# Patient Record
Sex: Male | Born: 2000 | Race: White | Hispanic: Yes | Marital: Single | Smoking: Never smoker
Health system: Southern US, Community
[De-identification: ages and names within clinical notes are randomized; demographics above are authoritative.]

## PROBLEM LIST (undated history)

## (undated) DIAGNOSIS — Z9109 Other allergy status, other than to drugs and biological substances: Secondary | ICD-10-CM

## (undated) DIAGNOSIS — L309 Dermatitis, unspecified: Secondary | ICD-10-CM

## (undated) HISTORY — PX: TONSILLECTOMY AND ADENOIDECTOMY: SUR1326

---
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
## 2019-02-01 ENCOUNTER — Encounter: Payer: Self-pay | Admitting: Emergency Medicine

## 2019-02-01 ENCOUNTER — Ambulatory Visit
Admission: EM | Admit: 2019-02-01 | Discharge: 2019-02-01 | Disposition: A | Discharge status: Home / Self Care | Payer: Medicaid Other | Attending: Emergency Medicine | Admitting: Emergency Medicine

## 2019-02-01 ENCOUNTER — Other Ambulatory Visit: Payer: Self-pay

## 2019-02-01 DIAGNOSIS — Z7251 High risk heterosexual behavior: Secondary | ICD-10-CM | POA: Diagnosis not present

## 2019-02-01 DIAGNOSIS — Z113 Encounter for screening for infections with a predominantly sexual mode of transmission: Secondary | ICD-10-CM | POA: Insufficient documentation

## 2019-02-01 NOTE — ED Provider Notes (Signed)
[facility]    CSN: [id number] Arrival date & time: 02/01/19  4098      History   Chief Complaint Chief Complaint  Patient presents with  . Exposure to STD    HPI Phillip Lopez is a 18 y.o. adult presenting for STD check.  Patient is asymptomatic, afebrile, and without known STD exposure, though endorsing unprotected intercourse with multiple male partners.    Past Medical History:  Diagnosis Date  . Asthma   . Environmental allergies     Patient Active Problem List   Diagnosis Date Noted  . OVERWEIGHT 03/02/2009  . ASTHMA 08/20/2007  . SINUSITIS-CHRONIC 11/10/2006  . ALLERGIC RHINITIS 07/21/2006    Past Surgical History:  Procedure Laterality Date  . TONSILLECTOMY AND ADENOIDECTOMY         Home Medications    Prior to Admission medications   Not on File    Family History History reviewed. No pertinent family history.  Social History Social History   Tobacco Use  . Smoking status: Never Smoker  . Smokeless tobacco: Never Used  Substance Use Topics  . Alcohol use: No  . Drug use: Yes    Types: Marijuana     Allergies   Patient has no known allergies.   Review of Systems Review of Systems  Constitutional: Negative for fatigue and fever.  Respiratory: Negative for cough and shortness of breath.   Cardiovascular: Negative for chest pain and palpitations.  Gastrointestinal: Negative for blood in stool, diarrhea and rectal pain.  Genitourinary: Negative for dysuria, frequency, genital sores and hematuria.       Negative for penile/testicular pain/swelling, penile discharge     Physical Exam Triage Vital Signs ED Triage Vitals  Enc Vitals Group     BP      Pulse      Resp      Temp      Temp src      SpO2      Weight      Height      Head Circumference      Peak Flow      Pain Score      Pain Loc      Pain Edu?      Excl. in [place]?    No data found.  Updated Vital Signs BP (!) 146/79 (BP Location: Left  Arm)   Pulse 61   Temp (!) 97.5 F (36.4 C) (Temporal)   Resp 18   SpO2 98%   Visual Acuity Right Eye Distance:   Left Eye Distance:   Bilateral Distance:    Right Eye Near:   Left Eye Near:    Bilateral Near:     Physical Exam Constitutional:      General: She is not in acute distress. HENT:     Head: Normocephalic and atraumatic.  Eyes:     General: No scleral icterus.    Pupils: Pupils are equal, round, and reactive to light.  Cardiovascular:     Rate and Rhythm: Normal rate.  Pulmonary:     Effort: Pulmonary effort is normal.  Genitourinary:    Comments: Patient perform self swab under provider supervision.  No meatal irritation, discharge observed. Skin:    Coloration: Skin is not jaundiced or pale.  Neurological:     Mental Status: She is alert and oriented to person, place, and time.      UC Treatments / Results  Labs (all labs ordered are listed, but only abnormal results  are displayed) Labs Reviewed  CYTOLOGY, (ORAL, ANAL, URETHRAL) ANCILLARY ONLY    EKG   Radiology No results found.  Procedures Procedures (including critical care time)  Medications Ordered in UC Medications - No data to display  Initial Impression / Assessment and Plan / UC Course  I have reviewed the triage vital signs and the nursing notes.  Pertinent labs & imaging results that were available during my care of the patient were reviewed by me and considered in my medical decision making (see chart for details).     Final Clinical Impressions(s) / UC Diagnoses   Final diagnoses:  Unprotected sex  Screening examination for venereal disease     Discharge Instructions     STD testing pending: We will call you if anything is positive and requiring treatment. Important to wear condoms during intercourse, limit number of sexual partners to reduce risk of getting, spreading STDs.    ED Prescriptions    None     PDMP not reviewed this encounter.   Odette Fraction  [place], [place] 02/01/19 1903

## 2019-02-01 NOTE — Discharge Instructions (Signed)
STD testing pending: We will call you if anything is positive and requiring treatment. Important to wear condoms during intercourse, limit number of sexual partners to reduce risk of getting, spreading STDs.

## 2019-02-01 NOTE — ED Triage Notes (Signed)
Pt presents to [facility] for STD Testing.

## 2019-02-01 NOTE — ED Notes (Signed)
Patient able to ambulate independently  
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
## 2019-02-04 ENCOUNTER — Telehealth (HOSPITAL_COMMUNITY): Payer: Self-pay | Admitting: Emergency Medicine

## 2019-02-04 LAB — CYTOLOGY, (ORAL, ANAL, URETHRAL) ANCILLARY ONLY
Chlamydia: POSITIVE — AB
Neisseria Gonorrhea: NEGATIVE
Trichomonas: NEGATIVE

## 2019-02-04 MED ORDER — AZITHROMYCIN 250 MG PO TABS: 1000.0000 mg | ORAL_TABLET | Freq: Once | ORAL | 0 refills | Status: AC

## 2019-02-04 NOTE — Telephone Encounter (Signed)
Chlamydia is positive.  Rx po zithromax 1g #1 dose no refills was sent to the pharmacy of record.  Pt needs education to please refrain from sexual intercourse for 7 days to give the medicine time to work, sexual partners need to be notified and tested/treated.  Condoms may reduce risk of reinfection.  Recheck or followup with PCP for further evaluation if symptoms are not improving.   [facility] notified.  Patient contacted and made aware of    results, all questions answered   

## 2019-02-28 ENCOUNTER — Other Ambulatory Visit: Payer: Self-pay

## 2019-02-28 ENCOUNTER — Encounter: Payer: Self-pay | Admitting: Emergency Medicine

## 2019-02-28 ENCOUNTER — Ambulatory Visit
Admission: EM | Admit: 2019-02-28 | Discharge: 2019-02-28 | Disposition: A | Discharge status: Home / Self Care | Payer: Medicaid Other | Attending: Emergency Medicine | Admitting: Emergency Medicine

## 2019-02-28 DIAGNOSIS — Z7251 High risk heterosexual behavior: Secondary | ICD-10-CM | POA: Insufficient documentation

## 2019-02-28 DIAGNOSIS — Z113 Encounter for screening for infections with a predominantly sexual mode of transmission: Secondary | ICD-10-CM | POA: Diagnosis present
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
## 2019-02-28 NOTE — ED Notes (Signed)
Patient able to ambulate independently  

## 2019-02-28 NOTE — Discharge Instructions (Addendum)

## 2019-02-28 NOTE — ED Triage Notes (Signed)
Pt presents to [facility] for assessment after being exposed to chlamydia.  Denies symptoms at this time.

## 2019-03-02 LAB — CYTOLOGY, (ORAL, ANAL, URETHRAL) ANCILLARY ONLY
Chlamydia: NEGATIVE
Neisseria Gonorrhea: NEGATIVE
Trichomonas: NEGATIVE

## 2019-04-25 ENCOUNTER — Emergency Department (HOSPITAL_COMMUNITY)
Admission: EM | Admit: 2019-04-25 | Discharge: 2019-04-25 | Disposition: A | Discharge status: Home / Self Care | Payer: Medicaid Other | Attending: Emergency Medicine | Admitting: Emergency Medicine

## 2019-04-25 ENCOUNTER — Encounter (HOSPITAL_COMMUNITY): Payer: Self-pay | Admitting: Emergency Medicine

## 2019-04-25 ENCOUNTER — Other Ambulatory Visit: Payer: Self-pay

## 2019-04-25 DIAGNOSIS — J029 Acute pharyngitis, unspecified: Secondary | ICD-10-CM | POA: Insufficient documentation

## 2019-04-25 DIAGNOSIS — Z20822 Contact with and (suspected) exposure to covid-19: Secondary | ICD-10-CM | POA: Insufficient documentation

## 2019-04-25 DIAGNOSIS — J45909 Unspecified asthma, uncomplicated: Secondary | ICD-10-CM | POA: Diagnosis not present

## 2019-04-25 NOTE — Discharge Instructions (Signed)
Your covid test is pending 

## 2019-04-25 NOTE — ED Provider Notes (Signed)
[facility] EMERGENCY DEPARTMENT Provider Note   CSN: [id number] Arrival date & time: 04/25/19  6295     History Chief Complaint  Patient presents with  . Sore Throat    Phillip Lopez is a 19 y.o. adult.  The history is provided by the patient. No language interpreter was used.  Sore Throat This is a new problem. The current episode started yesterday. The problem occurs constantly. The problem has been gradually worsening. Pertinent negatives include no headaches and no shortness of breath. Nothing aggravates the symptoms. Nothing relieves the symptoms. She has tried nothing for the symptoms. The treatment provided no relief.   Pt reports he needs a covid test before he can go back to work     Past Medical History:  Diagnosis Date  . Asthma   . Environmental allergies     Patient Active Problem List   Diagnosis Date Noted  . OVERWEIGHT 03/02/2009  . ASTHMA 08/20/2007  . SINUSITIS-CHRONIC 11/10/2006  . ALLERGIC RHINITIS 07/21/2006    Past Surgical History:  Procedure Laterality Date  . TONSILLECTOMY AND ADENOIDECTOMY         No family history on file.  Social History   Tobacco Use  . Smoking status: Never Smoker  . Smokeless tobacco: Never Used  Substance Use Topics  . Alcohol use: No  . Drug use: Yes    Types: Marijuana    Home Medications Prior to Admission medications   Not on File    Allergies    Patient has no known allergies.  Review of Systems   Review of Systems  Respiratory: Negative for shortness of breath.   Neurological: Negative for headaches.  All other systems reviewed and are negative.   Physical Exam Updated Vital Signs BP 138/77   Pulse (!) 52   Temp 99.1 F (37.3 C) (Oral)   Resp 16   SpO2 100%   Physical Exam Vitals and nursing note reviewed.  Constitutional:      Appearance: She is well-developed.  HENT:     Head: Normocephalic.     Mouth/Throat:     Mouth: Mucous membranes are moist.       Pharynx: No pharyngeal swelling or posterior oropharyngeal erythema.  Cardiovascular:     Rate and Rhythm: Normal rate.  Pulmonary:     Effort: Pulmonary effort is normal.  Abdominal:     General: There is no distension.  Musculoskeletal:        General: Normal range of motion.     Cervical back: Normal range of motion.  Neurological:     Mental Status: She is alert and oriented to person, place, and time.     ED Results / Procedures / Treatments   Labs (all labs ordered are listed, but only abnormal results are displayed) Labs Reviewed  NOVEL CORONAVIRUS, NAA (HOSP ORDER, SEND-OUT TO REF LAB; TAT 18-24 HRS)    EKG None  Radiology No results found.  Procedures Procedures (including critical care time)  Medications Ordered in ED Medications - No data to display  ED Course  I have reviewed the triage vital signs and the nursing notes.  Pertinent labs & imaging results that were available during my care of the patient were reviewed by me and considered in my medical decision making (see chart for details).    MDM Rules/Calculators/A&P                      MDM  Covid pending.  Pt given work note. Pt counseled on quarantine  Final Clinical Impression(s) / ED Diagnoses Final diagnoses:  Pharyngitis, unspecified etiology    Rx / DC Orders ED Discharge Orders    None    An After Visit Summary was printed and given to the patient.    Elson Areas, Cordelia Poche 04/25/19 1001    Mancel Bale, MD 04/25/19 [phone]

## 2019-04-25 NOTE — ED Triage Notes (Signed)
C/o throat swelling since this morning.  Denies pain at present.

## 2019-04-26 LAB — NOVEL CORONAVIRUS, NAA (HOSP ORDER, SEND-OUT TO REF LAB; TAT 18-24 HRS): SARS-CoV-2, NAA: NOT DETECTED

## 2019-11-16 ENCOUNTER — Other Ambulatory Visit: Payer: Medicaid Other

## 2019-11-16 ENCOUNTER — Other Ambulatory Visit: Payer: Self-pay | Admitting: Critical Care Medicine

## 2019-11-16 DIAGNOSIS — Z20822 Contact with and (suspected) exposure to covid-19: Secondary | ICD-10-CM | POA: Diagnosis not present

## 2019-11-17 DIAGNOSIS — Z23 Encounter for immunization: Secondary | ICD-10-CM | POA: Diagnosis not present

## 2019-11-18 LAB — NOVEL CORONAVIRUS, NAA: SARS-CoV-2, NAA: NOT DETECTED

## 2019-11-18 LAB — SARS-COV-2, NAA 2 DAY TAT

## 2020-09-03 DIAGNOSIS — Z7251 High risk heterosexual behavior: Secondary | ICD-10-CM | POA: Diagnosis not present

## 2020-09-03 DIAGNOSIS — Z202 Contact with and (suspected) exposure to infections with a predominantly sexual mode of transmission: Secondary | ICD-10-CM | POA: Diagnosis not present

## 2020-09-03 DIAGNOSIS — Z113 Encounter for screening for infections with a predominantly sexual mode of transmission: Secondary | ICD-10-CM | POA: Diagnosis not present

## 2021-11-16 ENCOUNTER — Emergency Department (HOSPITAL_COMMUNITY)
Admission: EM | Admit: 2021-11-16 | Discharge: 2021-11-17 | Disposition: A | Discharge status: Home / Self Care | Payer: Medicaid Other | Attending: Emergency Medicine | Admitting: Emergency Medicine

## 2021-11-16 ENCOUNTER — Other Ambulatory Visit: Payer: Self-pay

## 2021-11-16 ENCOUNTER — Emergency Department (HOSPITAL_COMMUNITY): Payer: Medicaid Other

## 2021-11-16 ENCOUNTER — Encounter (HOSPITAL_COMMUNITY): Payer: Self-pay | Admitting: Emergency Medicine

## 2021-11-16 DIAGNOSIS — M545 Low back pain, unspecified: Secondary | ICD-10-CM | POA: Insufficient documentation

## 2021-11-16 DIAGNOSIS — M25521 Pain in right elbow: Secondary | ICD-10-CM | POA: Insufficient documentation

## 2021-11-16 DIAGNOSIS — S199XXA Unspecified injury of neck, initial encounter: Secondary | ICD-10-CM | POA: Diagnosis not present

## 2021-11-16 DIAGNOSIS — Z23 Encounter for immunization: Secondary | ICD-10-CM | POA: Insufficient documentation

## 2021-11-16 LAB — CBC WITH DIFFERENTIAL/PLATELET
Abs Immature Granulocytes: 0.02 10*3/uL (ref 0.00–0.07)
Basophils Absolute: 0.1 10*3/uL (ref 0.0–0.1)
Basophils Relative: 1 %
Eosinophils Absolute: 0.1 10*3/uL (ref 0.0–0.5)
Eosinophils Relative: 2 %
HCT: 41.7 % (ref 39.0–52.0)
Hemoglobin: 14.4 g/dL (ref 13.0–17.0)
Immature Granulocytes: 0 %
Lymphocytes Relative: 32 %
Lymphs Abs: 2.3 10*3/uL (ref 0.7–4.0)
MCH: 30.4 pg (ref 26.0–34.0)
MCHC: 34.5 g/dL (ref 30.0–36.0)
MCV: 88.2 fL (ref 80.0–100.0)
Monocytes Absolute: 0.6 10*3/uL (ref 0.1–1.0)
Monocytes Relative: 8 %
Neutro Abs: 4.1 10*3/uL (ref 1.7–7.7)
Neutrophils Relative %: 57 %
Platelets: 237 10*3/uL (ref 150–400)
RBC: 4.73 MIL/uL (ref 4.22–5.81)
RDW: 12 % (ref 11.5–15.5)
WBC: 7.3 10*3/uL (ref 4.0–10.5)
nRBC: 0 % (ref 0.0–0.2)

## 2021-11-16 LAB — URINALYSIS, ROUTINE W REFLEX MICROSCOPIC
Bilirubin Urine: NEGATIVE
Glucose, UA: NEGATIVE mg/dL
Hgb urine dipstick: NEGATIVE
Ketones, ur: NEGATIVE mg/dL
Leukocytes,Ua: NEGATIVE
Nitrite: NEGATIVE
Protein, ur: NEGATIVE mg/dL
Specific Gravity, Urine: 1.015 (ref 1.005–1.030)
pH: 6.5 (ref 5.0–8.0)

## 2021-11-16 LAB — BASIC METABOLIC PANEL
Anion gap: 7 (ref 5–15)
BUN: 10 mg/dL (ref 6–20)
CO2: 26 mmol/L (ref 22–32)
Calcium: 9.4 mg/dL (ref 8.9–10.3)
Chloride: 106 mmol/L (ref 98–111)
Creatinine, Ser: 0.97 mg/dL (ref 0.61–1.24)
GFR, Estimated: >60 mL/min (ref >60)
Glucose, Bld: 101 mg/dL — ABNORMAL HIGH (ref 70–99)
Potassium: 3.5 mmol/L (ref 3.5–5.1)
Sodium: 139 mmol/L (ref 135–145)
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
## 2021-11-17 MED ORDER — NAPROXEN 500 MG PO TABS: 500.0000 mg | ORAL_TABLET | Freq: Two times a day (BID) | ORAL | 0 refills | Status: DC

## 2021-11-17 MED ORDER — LIDOCAINE 5 % EX PTCH: 1.0000 | MEDICATED_PATCH | CUTANEOUS | 0 refills | Status: DC

## 2021-11-17 MED ORDER — KETOROLAC TROMETHAMINE 60 MG/2ML IM SOLN
60.0000 mg | Freq: Once | INTRAMUSCULAR | Status: AC
  Administered 2021-11-17: 60 mg via INTRAMUSCULAR
  Filled 2021-11-17: qty 2

## 2021-11-17 MED ORDER — TETANUS-DIPHTH-ACELL PERTUSSIS 5-2.5-18.5 LF-MCG/0.5 IM SUSY
0.5000 mL | PREFILLED_SYRINGE | Freq: Once | INTRAMUSCULAR | Status: AC
  Administered 2021-11-17: 0.5 mL via INTRAMUSCULAR
  Filled 2021-11-17: qty 0.5

## 2021-11-17 NOTE — ED Notes (Signed)
Legal guardian contacted. Mom, is aware that the patient is here and being discharged.
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
## 2022-01-25 ENCOUNTER — Encounter (HOSPITAL_COMMUNITY): Payer: Self-pay | Admitting: Emergency Medicine

## 2022-01-25 ENCOUNTER — Emergency Department (HOSPITAL_COMMUNITY): Payer: Medicaid Other

## 2022-01-25 ENCOUNTER — Emergency Department (HOSPITAL_COMMUNITY)
Admission: EM | Admit: 2022-01-25 | Discharge: 2022-01-25 | Disposition: A | Discharge status: Home / Self Care | Payer: Medicaid Other | Attending: Emergency Medicine | Admitting: Emergency Medicine

## 2022-01-25 ENCOUNTER — Other Ambulatory Visit: Payer: Self-pay

## 2022-01-25 DIAGNOSIS — S6991XA Unspecified injury of right wrist, hand and finger(s), initial encounter: Secondary | ICD-10-CM | POA: Diagnosis present

## 2022-01-25 DIAGNOSIS — T148XXA Other injury of unspecified body region, initial encounter: Secondary | ICD-10-CM

## 2022-01-25 DIAGNOSIS — S62201B Unspecified fracture of first metacarpal bone, right hand, initial encounter for open fracture: Secondary | ICD-10-CM

## 2022-01-25 DIAGNOSIS — S62231B Other displaced fracture of base of first metacarpal bone, right hand, initial encounter for open fracture: Secondary | ICD-10-CM | POA: Diagnosis not present

## 2022-01-25 DIAGNOSIS — Z23 Encounter for immunization: Secondary | ICD-10-CM | POA: Insufficient documentation

## 2022-01-25 DIAGNOSIS — W2209XA Striking against other stationary object, initial encounter: Secondary | ICD-10-CM | POA: Diagnosis not present

## 2022-01-25 MED ORDER — OXYCODONE-ACETAMINOPHEN 5-325 MG PO TABS: 1.0000 | ORAL_TABLET | Freq: Three times a day (TID) | ORAL | 0 refills | Status: DC | PRN

## 2022-01-25 MED ORDER — DOXYCYCLINE HYCLATE 100 MG PO CAPS: 100.0000 mg | ORAL_CAPSULE | Freq: Two times a day (BID) | ORAL | 0 refills | Status: DC

## 2022-01-25 MED ORDER — CEPHALEXIN 500 MG PO CAPS: 500.0000 mg | ORAL_CAPSULE | Freq: Four times a day (QID) | ORAL | 0 refills | Status: DC

## 2022-01-25 MED ORDER — LIDOCAINE-EPINEPHRINE-TETRACAINE (LET) TOPICAL GEL
3.0000 mL | Freq: Once | TOPICAL | Status: AC
  Administered 2022-01-25: 3 mL via TOPICAL
  Filled 2022-01-25: qty 3

## 2022-01-25 MED ORDER — BACITRACIN ZINC 500 UNIT/GM EX OINT: TOPICAL_OINTMENT | Freq: Two times a day (BID) | CUTANEOUS | Status: DC

## 2022-01-25 MED ORDER — LIDOCAINE-EPINEPHRINE 2 %-1:200000 IJ SOLN
10.0000 mL | Freq: Once | INTRAMUSCULAR | Status: AC
Start: 2022-01-25 — End: 2022-01-25
  Administered 2022-01-25: 10 mL
  Filled 2022-01-25: qty 20

## 2022-01-25 MED ORDER — TETANUS-DIPHTH-ACELL PERTUSSIS 5-2.5-18.5 LF-MCG/0.5 IM SUSY
0.5000 mL | PREFILLED_SYRINGE | Freq: Once | INTRAMUSCULAR | Status: DC
  Filled 2022-01-25: qty 0.5

## 2022-01-25 MED ORDER — CEFAZOLIN SODIUM-DEXTROSE 2-4 GM/100ML-% IV SOLN
2.0000 g | INTRAVENOUS | Status: AC
  Administered 2022-01-25: 2 g via INTRAVENOUS
  Filled 2022-01-25: qty 100
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
## 2022-01-25 NOTE — ED Triage Notes (Signed)
Patient here after punching a concrete wall.  Bleeding controlled.  Patient has laceration over knuckle of right pointer finger.  Swelling at area.
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
## 2022-01-25 NOTE — Progress Notes (Signed)
Orthopedic Tech Progress Note Patient Details:  Phillip Lopez 04/28/2000 [id number]  Well-padded volar splint applied in best obtainable fashion to the RUE. I extended the splint to just below the PIP's, but above the MCP's as to not create a fulcrum for the metacarpal fx to possibly become increasingly angulated or displaced. Encouraged ice and elevation to help with pain/swelling.   Ortho Devices Type of Ortho Device: Volar splint, Cotton web roll Ortho Device/Splint Location: RUE Ortho Device/Splint Interventions: Ordered, Adjustment, Application   Post Interventions Patient Tolerated: Poor, Fair Instructions Provided: Care of device  Phillip Lopez Jeri Modena 01/25/2022, 5:47 PM

## 2022-01-25 NOTE — ED Provider Triage Note (Signed)
Emergency Medicine Provider Triage Evaluation Note  Phillip Lopez , a 21 y.o. male  was evaluated in triage.  Pt complains of hand injury just PTA. Punched a wall. Laceration and pain/ swelling to right 2nd metacarpal. No numbness or weakness. Pain worse with movement. Tetanus up to date  Review of Systems  Positive: Hand injury Negative:   Physical Exam  BP (!) 143/73 (BP Location: Left Arm)   Pulse (!) 57   Temp 98.2 F (36.8 C) (Oral)   Resp 16   SpO2 98%  Gen:   Awake, no distress   Resp:  Normal effort  MSK:   Moves extremities without difficulty, laceration, pain, swelling over second metacarpal Other:    Medical Decision Making  Medically screening exam initiated at 4:31 AM.  Appropriate orders placed.  Phillip Lopez was informed that the remainder of the evaluation will be completed by another provider, this initial triage assessment does not replace that evaluation, and the importance of remaining in the ED until their evaluation is complete.  Hand injury   Phillip Lopez A, PA-C 01/25/22 0431

## 2022-01-25 NOTE — Discharge Instructions (Addendum)
Leave your bandages /splint on, keeping them clean and dry It is very important to take the antibiotics that you have been prescribed  You may have nothing to eat or drink after midnight Sunday night--yes, this means nothing at all Arrive at the [facility] [place]. [place] at [employer] Monday morning  WORK STATUS: No work with right hand greater than paper/pencil tasks x1 month

## 2022-01-25 NOTE — ED Notes (Signed)
Discharge instructions reviewed with patient and family. Patient verbalized understanding of follow-up care and medications. Pt declined any comments or concerns at this time. Ambulatory to lobby with family.

## 2022-01-25 NOTE — ED Provider Notes (Signed)
Personally reviewed and interpreted patient's imaging.  He has comminuted second metacarpal fracture overlying gas, suspicious for open fracture.  This correlates with exam with laceration overlying swelling.  Nursing aware patient needs room in back.  Orders for Ancef placed.   Maleeha Halls A, PA-C 01/25/22 Ririe, April, MD 01/25/22 [phone]

## 2022-01-25 NOTE — ED Provider Notes (Cosign Needed Addendum)
Consultations Obtained:  I requested consultation with the hand surgeon, Dr. Grandville Silos,  and discussed lab and imaging findings as well as pertinent plan - they recommend: irrigate, leave open and allow to drain, splint, abx, office to contact patient to schedule follow up.    Tacy Learn, PA-C 01/25/22 1439     Tacy Learn, PA-C 01/25/22 1439    Varney Biles, MD 01/26/22 1309

## 2022-01-27 ENCOUNTER — Other Ambulatory Visit: Payer: Self-pay

## 2022-01-27 ENCOUNTER — Encounter (HOSPITAL_BASED_OUTPATIENT_CLINIC_OR_DEPARTMENT_OTHER): Payer: Self-pay | Admitting: Orthopedic Surgery

## 2022-01-27 ENCOUNTER — Encounter (HOSPITAL_BASED_OUTPATIENT_CLINIC_OR_DEPARTMENT_OTHER)
Admission: RE | Disposition: A | Discharge status: Home / Self Care | Payer: Self-pay | Source: Home / Self Care | Attending: Orthopedic Surgery

## 2022-01-27 ENCOUNTER — Ambulatory Visit (HOSPITAL_COMMUNITY): Payer: Medicaid Other

## 2022-01-27 ENCOUNTER — Ambulatory Visit (HOSPITAL_BASED_OUTPATIENT_CLINIC_OR_DEPARTMENT_OTHER)
Admission: RE | Admit: 2022-01-27 | Discharge: 2022-01-27 | Disposition: A | Discharge status: Home / Self Care | Payer: Medicaid Other | Attending: Orthopedic Surgery | Admitting: Orthopedic Surgery

## 2022-01-27 ENCOUNTER — Ambulatory Visit (HOSPITAL_BASED_OUTPATIENT_CLINIC_OR_DEPARTMENT_OTHER): Payer: Medicaid Other | Admitting: Certified Registered"

## 2022-01-27 DIAGNOSIS — S648X1A Injury of other nerves at wrist and hand level of right arm, initial encounter: Secondary | ICD-10-CM | POA: Insufficient documentation

## 2022-01-27 DIAGNOSIS — W2201XA Walked into wall, initial encounter: Secondary | ICD-10-CM | POA: Diagnosis not present

## 2022-01-27 DIAGNOSIS — J45909 Unspecified asthma, uncomplicated: Secondary | ICD-10-CM

## 2022-01-27 DIAGNOSIS — S62300A Unspecified fracture of second metacarpal bone, right hand, initial encounter for closed fracture: Secondary | ICD-10-CM | POA: Insufficient documentation

## 2022-01-27 DIAGNOSIS — S5431XA Injury of cutaneous sensory nerve at forearm level, right arm, initial encounter: Secondary | ICD-10-CM | POA: Diagnosis not present

## 2022-01-27 HISTORY — PX: OPEN REDUCTION INTERNAL FIXATION (ORIF) METACARPAL: SHX6234

## 2022-01-27 HISTORY — PX: NERVE REPAIR: SHX2083

## 2022-01-27 SURGERY — OPEN REDUCTION INTERNAL FIXATION (ORIF) METACARPAL
Anesthesia: Regional | Site: Hand | Laterality: Right

## 2022-01-27 MED ORDER — OXYCODONE HCL 5 MG/5ML PO SOLN: 5.0000 mg | Freq: Once | ORAL | Status: DC | PRN

## 2022-01-27 MED ORDER — FENTANYL CITRATE (PF) 100 MCG/2ML IJ SOLN
INTRAMUSCULAR | Status: AC
  Filled 2022-01-27: qty 2

## 2022-01-27 MED ORDER — CEFAZOLIN SODIUM-DEXTROSE 2-4 GM/100ML-% IV SOLN
INTRAVENOUS | Status: AC
  Filled 2022-01-27: qty 100

## 2022-01-27 MED ORDER — FENTANYL CITRATE (PF) 100 MCG/2ML IJ SOLN
100.0000 ug | Freq: Once | INTRAMUSCULAR | Status: AC
  Administered 2022-01-27: 100 ug via INTRAVENOUS

## 2022-01-27 MED ORDER — PROMETHAZINE HCL 25 MG/ML IJ SOLN: 6.2500 mg | INTRAMUSCULAR | Status: DC | PRN

## 2022-01-27 MED ORDER — AMISULPRIDE (ANTIEMETIC) 5 MG/2ML IV SOLN: 10.0000 mg | Freq: Once | INTRAVENOUS | Status: DC | PRN

## 2022-01-27 MED ORDER — LACTATED RINGERS IV SOLN: INTRAVENOUS | Status: DC | PRN

## 2022-01-27 MED ORDER — ONDANSETRON HCL 4 MG/2ML IJ SOLN
INTRAMUSCULAR | Status: DC | PRN
  Administered 2022-01-27: 4 mg via INTRAVENOUS

## 2022-01-27 MED ORDER — LIDOCAINE HCL (CARDIAC) PF 100 MG/5ML IV SOSY
PREFILLED_SYRINGE | INTRAVENOUS | Status: DC | PRN
  Administered 2022-01-27: 30 mg via INTRAVENOUS

## 2022-01-27 MED ORDER — ACETAMINOPHEN 500 MG PO TABS
1000.0000 mg | ORAL_TABLET | Freq: Once | ORAL | Status: AC
  Administered 2022-01-27: 1000 mg via ORAL

## 2022-01-27 MED ORDER — MIDAZOLAM HCL 2 MG/2ML IJ SOLN
INTRAMUSCULAR | Status: AC
  Filled 2022-01-27: qty 2

## 2022-01-27 MED ORDER — FENTANYL CITRATE (PF) 100 MCG/2ML IJ SOLN: 25.0000 ug | INTRAMUSCULAR | Status: DC | PRN

## 2022-01-27 MED ORDER — KETOROLAC TROMETHAMINE 30 MG/ML IJ SOLN: 30.0000 mg | Freq: Once | INTRAMUSCULAR | Status: DC | PRN

## 2022-01-27 MED ORDER — DEXMEDETOMIDINE HCL IN NACL 80 MCG/20ML IV SOLN
INTRAVENOUS | Status: DC | PRN
  Administered 2022-01-27: 8 ug via BUCCAL

## 2022-01-27 MED ORDER — 0.9 % SODIUM CHLORIDE (POUR BTL) OPTIME
TOPICAL | Status: DC | PRN
  Administered 2022-01-27: 200 mL

## 2022-01-27 MED ORDER — BUPIVACAINE HCL (PF) 0.25 % IJ SOLN
INTRAMUSCULAR | Status: AC
  Filled 2022-01-27: qty 30

## 2022-01-27 MED ORDER — PROPOFOL 10 MG/ML IV BOLUS
INTRAVENOUS | Status: DC | PRN
  Administered 2022-01-27: 125 ug/kg/min via INTRAVENOUS

## 2022-01-27 MED ORDER — CEFAZOLIN SODIUM-DEXTROSE 2-4 GM/100ML-% IV SOLN: 2.0000 g | INTRAVENOUS | Status: DC

## 2022-01-27 MED ORDER — OXYCODONE HCL 5 MG PO TABS: 5.0000 mg | ORAL_TABLET | Freq: Once | ORAL | Status: DC | PRN

## 2022-01-27 MED ORDER — ACETAMINOPHEN 500 MG PO TABS
ORAL_TABLET | ORAL | Status: AC
  Filled 2022-01-27: qty 2

## 2022-01-27 MED ORDER — MIDAZOLAM HCL 2 MG/2ML IJ SOLN
2.0000 mg | Freq: Once | INTRAMUSCULAR | Status: AC
  Administered 2022-01-27: 2 mg via INTRAVENOUS

## 2022-01-27 MED ORDER — BUPIVACAINE-EPINEPHRINE (PF) 0.5% -1:200000 IJ SOLN
INTRAMUSCULAR | Status: DC | PRN
  Administered 2022-01-27: 30 mL via PERINEURAL

## 2022-01-27 SURGICAL SUPPLY — 58 items
APL PRP STRL LF DISP 70% ISPRP (MISCELLANEOUS) ×2
BIT DRILL 1.1 MINI QC NONSTRL (BIT) IMPLANT
BLADE MINI RND TIP GREEN BEAV (BLADE) IMPLANT
BLADE SURG 15 STRL LF DISP TIS (BLADE) ×2 IMPLANT
BLADE SURG 15 STRL SS (BLADE) ×2
BNDG CMPR 5X4 CHSV STRCH STRL (GAUZE/BANDAGES/DRESSINGS) ×2
BNDG CMPR 9X4 STRL LF SNTH (GAUZE/BANDAGES/DRESSINGS) ×2
BNDG COHESIVE 4X5 TAN STRL LF (GAUZE/BANDAGES/DRESSINGS) ×2 IMPLANT
BNDG ESMARK 4X9 LF (GAUZE/BANDAGES/DRESSINGS) ×2 IMPLANT
BNDG GAUZE DERMACEA FLUFF 4 (GAUZE/BANDAGES/DRESSINGS) ×2 IMPLANT
BNDG GZE DERMACEA 4 6PLY (GAUZE/BANDAGES/DRESSINGS) ×2
CANISTER SUCT 1200ML W/VALVE (MISCELLANEOUS) IMPLANT
CHLORAPREP W/TINT 26 (MISCELLANEOUS) ×2 IMPLANT
CORD BIPOLAR FORCEPS 12FT (ELECTRODE) ×2 IMPLANT
COVER BACK TABLE 60X90IN (DRAPES) ×2 IMPLANT
COVER MAYO STAND STRL (DRAPES) ×2 IMPLANT
CUFF TOURN SGL QUICK 18X4 (TOURNIQUET CUFF) IMPLANT
DRAPE C-ARM 42X72 X-RAY (DRAPES) ×2 IMPLANT
DRAPE EXTREMITY T 121X128X90 (DISPOSABLE) ×2 IMPLANT
DRAPE SURG 17X23 STRL (DRAPES) ×2 IMPLANT
DRIVER BIT 1.5 (TRAUMA) IMPLANT
DRSG EMULSION OIL 3X3 NADH (GAUZE/BANDAGES/DRESSINGS) ×2 IMPLANT
GAUZE SPONGE 4X4 12PLY STRL LF (GAUZE/BANDAGES/DRESSINGS) ×2 IMPLANT
GLOVE BIO SURGEON STRL SZ7.5 (GLOVE) ×2 IMPLANT
GLOVE BIOGEL PI IND STRL 7.0 (GLOVE) ×2 IMPLANT
GLOVE BIOGEL PI IND STRL 8 (GLOVE) ×2 IMPLANT
GLOVE ECLIPSE 6.5 STRL STRAW (GLOVE) ×2 IMPLANT
GOWN STRL REUS W/ TWL LRG LVL3 (GOWN DISPOSABLE) ×4 IMPLANT
GOWN STRL REUS W/TWL LRG LVL3 (GOWN DISPOSABLE) ×4
GOWN STRL REUS W/TWL XL LVL3 (GOWN DISPOSABLE) ×2 IMPLANT
LOCK SCREW 1.5X15MM (Screw) ×4 IMPLANT
NDL HYPO 25X1 1.5 SAFETY (NEEDLE) IMPLANT
NEEDLE HYPO 25X1 1.5 SAFETY (NEEDLE) IMPLANT
NS IRRIG 1000ML POUR BTL (IV SOLUTION) ×2 IMPLANT
PACK BASIN DAY SURGERY FS (CUSTOM PROCEDURE TRAY) ×2 IMPLANT
PADDING CAST ABS COTTON 4X4 ST (CAST SUPPLIES) IMPLANT
PLATE T SMALL 1.5MM (Plate) IMPLANT
SCREW L 1.5X12 (Screw) IMPLANT
SCREW L 1.5X14 (Screw) IMPLANT
SCREW LOCK 1.5X15MM (Screw) IMPLANT
SCREW LOCKING 1.5X13MM (Screw) IMPLANT
SLEEVE SCD COMPRESS KNEE MED (STOCKING) ×2 IMPLANT
SLING ARM FOAM STRAP LRG (SOFTGOODS) IMPLANT
SPLINT PLASTER CAST XFAST 3X15 (CAST SUPPLIES) ×2 IMPLANT
SPLINT UNIVERSAL RIGHT (SOFTGOODS) IMPLANT
STOCKINETTE 6  STRL (DRAPES) ×2
STOCKINETTE 6 STRL (DRAPES) ×2 IMPLANT
SUCTION FRAZIER HANDLE 10FR (MISCELLANEOUS) ×2
SUCTION TUBE FRAZIER 10FR DISP (MISCELLANEOUS) IMPLANT
SUT ETHILON 8 0 BV130 4 (SUTURE) IMPLANT
SUT VIC AB 3-0 PS2 18 (SUTURE) IMPLANT
SUT VICRYL RAPIDE 4-0 (SUTURE) IMPLANT
SUT VICRYL RAPIDE 4/0 PS 2 (SUTURE) IMPLANT
SYR 10ML LL (SYRINGE) IMPLANT
SYR BULB EAR ULCER 3OZ GRN STR (SYRINGE) IMPLANT
TOWEL GREEN STERILE FF (TOWEL DISPOSABLE) ×2 IMPLANT
TUBE CONNECTING 20X1/4 (TUBING) IMPLANT
UNDERPAD 30X36 HEAVY ABSORB (UNDERPADS AND DIAPERS) ×2 IMPLANT

## 2022-01-27 NOTE — Interval H&P Note (Signed)
History and Physical Interval Note:  01/27/2022 8:21 AM  Phillip Lopez  has presented today for surgery, with the diagnosis of RIGHT 2ND METACARPAL FRACTURE.  The various methods of treatment have been discussed with the patient and family. After consideration of risks, benefits and other options for treatment, the patient has consented to  Procedure(s) with comments: OPEN TREATMENT OF RIGHT 2ND METACARPAL FRACTURE (Right) - WITH PREOP REGIONAL BLOCK as a surgical intervention.  The patient's history has been reviewed, patient examined, no change in status, stable for surgery.  I have reviewed the patient's chart and labs.  Questions were answered to the patient's satisfaction.     Jolyn Nap

## 2022-01-27 NOTE — Anesthesia Preprocedure Evaluation (Addendum)
Anesthesia Evaluation  Patient identified by MRN, date of birth, ID band Patient awake    Reviewed: Allergy & Precautions, NPO status , Patient's Chart, lab work & pertinent test results  Airway Mallampati: III  TM Distance: >3 FB Neck ROM: Full    Dental no notable dental hx.    Pulmonary asthma ,    Pulmonary exam normal        Cardiovascular negative cardio ROS Normal cardiovascular exam     Neuro/Psych negative neurological ROS  negative psych ROS   GI/Hepatic negative GI ROS, Neg liver ROS,   Endo/Other  negative endocrine ROS  Renal/GU negative Renal ROS     Musculoskeletal negative musculoskeletal ROS (+)   Abdominal   Peds  Hematology negative hematology ROS (+)   Anesthesia Other Findings RIGHT 2ND METACARPAL FRACTURE  Reproductive/Obstetrics                            Anesthesia Physical Anesthesia Plan  ASA: 1  Anesthesia Plan: Regional   Post-op Pain Management: Regional block*   Induction: Intravenous  PONV Risk Score and Plan: 1 and Ondansetron, Dexamethasone, Propofol infusion, Midazolam and Treatment may vary due to age or medical condition  Airway Management Planned: Simple Face Mask  Additional Equipment:   Intra-op Plan:   Post-operative Plan:   Informed Consent: I have reviewed the patients History and Physical, chart, labs and discussed the procedure including the risks, benefits and alternatives for the proposed anesthesia with the patient or authorized representative who has indicated his/her understanding and acceptance.     Dental advisory given  Plan Discussed with: CRNA  Anesthesia Plan Comments:        Anesthesia Quick Evaluation

## 2022-01-27 NOTE — Anesthesia Postprocedure Evaluation (Signed)
Anesthesia Post Note  Patient: Phillip Lopez  Procedure(s) Performed: OPEN TREATMENT OF RIGHT 2ND METACARPAL FRACTURE (Right: Finger) NERVE REPAIR (Hand)     Patient location during evaluation: PACU Anesthesia Type: Regional Level of consciousness: awake Pain management: pain level controlled Vital Signs Assessment: post-procedure vital signs reviewed and stable Respiratory status: spontaneous breathing, nonlabored ventilation, respiratory function stable and patient connected to nasal cannula oxygen Cardiovascular status: stable and blood pressure returned to baseline Postop Assessment: no apparent nausea or vomiting Anesthetic complications: no   No notable events documented.  Last Vitals:  Vitals:   01/27/22 1030 01/27/22 1050  BP: 114/62 100/74  Pulse: (!) 51 (!) 54  Resp: 18 18  Temp:  36.5 C  SpO2: 97% 97%    Last Pain:  Vitals:   01/27/22 1050  TempSrc:   PainSc: 0-No pain                 Sarena Jezek P Jody Aguinaga

## 2022-01-27 NOTE — Anesthesia Procedure Notes (Signed)
Anesthesia Regional Block: Supraclavicular block   Pre-Anesthetic Checklist: , timeout performed,  Correct Patient, Correct Site, Correct Laterality,  Correct Procedure, Correct Position, site marked,  Risks and benefits discussed,  Surgical consent,  Pre-op evaluation,  At surgeon's request and post-op pain management  Laterality: Right  Prep: chloraprep       Needles:  Injection technique: Single-shot  Needle Type: Echogenic Stimulator Needle     Needle Length: 9cm  Needle Gauge: 21     Additional Needles:   Procedures:,,,, ultrasound used (permanent image in chart),,    Narrative:  Start time: 01/27/2022 8:20 AM End time: 01/27/2022 8:30 AM Injection made incrementally with aspirations every 5 mL.  Performed by: Personally  Anesthesiologist: Murvin Natal, MD  Additional Notes: Functioning IV was confirmed and monitors were applied.  A timeout was performed. Sterile prep, hand hygiene and sterile gloves were used. A 44mm 21ga Arrow echogenic stimulator needle was used. Negative aspiration and negative test dose prior to incremental administration of local anesthetic. The patient tolerated the procedure well.  Ultrasound guidance: relevent anatomy identified, needle position confirmed, local anesthetic spread visualized around nerve(s), vascular puncture avoided.  Image printed for medical record.

## 2022-01-27 NOTE — Transfer of Care (Signed)
Immediate Anesthesia Transfer of Care Note  Patient: Phillip Lopez  Procedure(s) Performed: OPEN TREATMENT OF RIGHT 2ND METACARPAL FRACTURE (Right: Finger) NERVE REPAIR (Hand)  Patient Location: PACU  Anesthesia Type:MAC combined with regional for post-op pain  Level of Consciousness: drowsy and patient cooperative  Airway & Oxygen Therapy: Patient Spontanous Breathing and Patient connected to face mask oxygen  Post-op Assessment: Report given to RN and Post -op Vital signs reviewed and stable  Post vital signs: Reviewed and stable  Last Vitals:  Vitals Value Taken Time  BP    Temp    Pulse 56 01/27/22 0943  Resp    SpO2 99 % 01/27/22 0943  Vitals shown include unvalidated device data.  Last Pain:  Vitals:   01/27/22 0715  TempSrc: Oral  PainSc: 0-No pain      Patients Stated Pain Goal: 3 (12/21/28 0762)  Complications: No notable events documented.

## 2022-01-27 NOTE — Anesthesia Procedure Notes (Signed)
Procedure Name: MAC Date/Time: 01/27/2022 9:00 AM  Performed by: Signe Colt, CRNAPre-anesthesia Checklist: Patient identified, Emergency Drugs available, Suction available, Patient being monitored and Timeout performed Patient Re-evaluated:Patient Re-evaluated prior to induction Oxygen Delivery Method: Simple face mask

## 2022-01-27 NOTE — Progress Notes (Signed)
Assisted Dr. Ellender with right, supraclavicular block. Side rails up, monitors on throughout procedure. See vital signs in flow sheet. Tolerated Procedure well. 

## 2022-01-27 NOTE — Op Note (Addendum)
01/27/2022  8:23 AM  PATIENT:  Phillip Lopez  21 y.o. male  PRE-OPERATIVE DIAGNOSIS: Displaced right second metacarpal fracture with overlying wound  POST-OPERATIVE DIAGNOSIS:  Same, plus dorsal cutaneous nerve injury  PROCEDURE:  1. Open treatment of right second metacarpal fracture, [id number]    2. Excisional debridement of skin only, 97597    3. Simple closure hand laceration, 1 cm, 12001    4. Microsurgical repair of dorsal cutaneous nerve, [phone]  SURGEON: Rayvon Char. Grandville Silos, MD  PHYSICIAN ASSISTANT: Morley Kos, OPA-C  ANESTHESIA: Regional block/MAC  SPECIMENS:  None  DRAINS: None  EBL: Less than 10 mL  PREOPERATIVE INDICATIONS:  Phillip Lopez is a  21 y.o. male with a displaced right second metacarpal fracture with digital malrotation  The risks benefits and alternatives were discussed with the patient preoperatively including but not limited to the risks of infection, bleeding, nerve injury, cardiopulmonary complications, the need for revision surgery, among others, and the patient verbalized understanding and consented to proceed.  OPERATIVE IMPLANTS: Biomet ALPS 1.5 mm plate/screws  OPERATIVE PROCEDURE: After receiving prophylactic antibiotics and a regional block, the patient was escorted to the operative theatre and placed in a supine position.   A surgical "time-out" was performed during which the planned procedure, proposed operative site, and the correct patient identity were compared to the operative consent and agreement confirmed by the circulating nurse according to current facility policy. Following application of a tourniquet to the operative extremity, the exposed skin was pre-scrubbed with Hibiclens scrub brush and then was prepped with Chloraprep and draped in the usual sterile fashion. The limb was exsanguinated with an Esmarch bandage and the tourniquet inflated to approximately 187mHg higher than systolic BP.   The transverse laceration near  the distal aspect of the second metacarpal was excised at the margins of devitalized skin using scissors and forceps.  It was 1cm in length.  It was then extended into a zigzag type incision with a long proximal radial limb.  Full-thickness flaps were elevated.  A small dorsal cutaneous nerve headed toward the 2nd web space was found to have also been injured and was noted for later repair.  Working in the interval between the 2 extensor tendons, the muscle fascia and periosteum overlying the second metacarpal was incised and reflected radially and ulnarly.  The fracture was cleaned of debris, provisionally reduced and clamped and then the reduction secured with a small T plate from the Biomet ALPS handset, placing all 1.5 mm locking screws.  Alignment was anatomic.  Final images were obtained.  Clinically the digit had excellent rotation.  The wound was irrigated and the muscle fascia loosely reapproximated with 4-0 Vicryl Rapide running suture.  1 stitch was also placed distally where the 2 extensor tendons merged together into the sagittal apparatus to help secure the 2 together.  The tourniquet was released additional hemostasis unnecessary and then the cutaneous nerve that had been identified previously was repaired with primary neurorrhaphy using 8-0 nylon epineurial sutures x2.  This was done with loupe assisted magnification.  The skin incision was then reapproximated with 4-0 Vicryl Rapide running horizontal mattress sutures.  The traumatic laceration was also repaired as simple wound closure with the same suture type.  A bulky dressing with removable Velcro splint over top of it was applied and the patient was taken to the recovery room in stable condition.  DISPOSITION: The patient will be discharged home today with typical post-op instructions, returning in 10-15 days for reevaluation with new  x-rays of the right hand out of the splint

## 2022-01-27 NOTE — Discharge Instructions (Addendum)
Discharge Instructions   You have a dressing with a plaster splint incorporated in it. Move your fingers as much as possible, making a full fist and fully opening the fist. Elevate your hand to reduce pain & swelling of the digits.  Ice over the operative site may be helpful to reduce pain & swelling.  DO NOT USE HEAT. Take Ibuprofen 600 mg and Tylenol 650 mg every 6 hours together. This medicine is over the counter. Take the pain medicine (oxycodone) as needed for severe post operative pain as a rescue medicine. Take the remainder of the antibiotics as stated on the bottle. May have Tylenol today, 01/27/2022, after 1:22 PM Leave the dressing in place until you return to our office.  You may shower, but keep the bandage clean & dry.  You may drive a car when you are off of prescription pain medications and can safely control your vehicle with both hands. Call our office to schedule a follow up appointment in 10-15 days from today.   Please call [phone] during normal business hours or [phone] after hours for any problems. Including the following:  - excessive redness of the incisions - drainage for more than 4 days - fever of more than 101.5 F  *Please note that pain medications will not be refilled after hours or on weekends.  Work note previously provided in hospital discharge paperwork.    Post Anesthesia Home Care Instructions  Activity: Get plenty of rest for the remainder of the day. A responsible individual must stay with you for 24 hours following the procedure.  For the next 24 hours, DO NOT: -Drive a car -Paediatric nurse -Drink alcoholic beverages -Take any medication unless instructed by your physician -Make any legal decisions or sign important papers.  Meals: Start with liquid foods such as gelatin or soup. Progress to regular foods as tolerated. Avoid greasy, spicy, heavy foods. If nausea and/or vomiting occur, drink only clear liquids until the nausea  and/or vomiting subsides. Call your physician if vomiting continues.  Special Instructions/Symptoms: Your throat may feel dry or sore from the anesthesia or the breathing tube placed in your throat during surgery. If this causes discomfort, gargle with warm salt water. The discomfort should disappear within 24 hours.  If you had a scopolamine patch placed behind your ear for the management of post- operative nausea and/or vomiting:  1. The medication in the patch is effective for 72 hours, after which it should be removed.  Wrap patch in a tissue and discard in the trash. Wash hands thoroughly with soap and water. 2. You may remove the patch earlier than 72 hours if you experience unpleasant side effects which may include dry mouth, dizziness or visual disturbances. 3. Avoid touching the patch. Wash your hands with soap and water after contact with the patch.

## 2022-01-28 ENCOUNTER — Other Ambulatory Visit: Payer: Self-pay

## 2022-01-28 ENCOUNTER — Encounter (HOSPITAL_BASED_OUTPATIENT_CLINIC_OR_DEPARTMENT_OTHER): Payer: Self-pay | Admitting: Orthopedic Surgery

## 2022-07-18 ENCOUNTER — Ambulatory Visit
Admission: EM | Admit: 2022-07-18 | Discharge: 2022-07-18 | Disposition: A | Discharge status: Home / Self Care | Payer: Medicaid Other | Attending: Internal Medicine | Admitting: Internal Medicine

## 2022-07-18 ENCOUNTER — Other Ambulatory Visit: Payer: Self-pay

## 2022-07-18 DIAGNOSIS — J309 Allergic rhinitis, unspecified: Secondary | ICD-10-CM | POA: Diagnosis not present

## 2022-07-18 DIAGNOSIS — H1013 Acute atopic conjunctivitis, bilateral: Secondary | ICD-10-CM

## 2022-07-18 MED ORDER — LEVOCETIRIZINE DIHYDROCHLORIDE 5 MG PO TABS: 5.0000 mg | ORAL_TABLET | Freq: Every evening | ORAL | 0 refills | Status: DC

## 2022-07-18 MED ORDER — OLOPATADINE HCL 0.1 % OP SOLN: 1.0000 [drp] | Freq: Two times a day (BID) | OPHTHALMIC | 0 refills | Status: AC

## 2022-07-18 MED ORDER — FLUTICASONE PROPIONATE 50 MCG/ACT NA SUSP: 1.0000 | Freq: Every day | NASAL | 0 refills | Status: DC

## 2022-07-18 NOTE — ED Provider Notes (Addendum)
[facility]    CSN: [id number] Arrival date & time: 07/18/22  1232      History   Chief Complaint Chief Complaint  Patient presents with   Allergic Reaction    Pollen - Entered by patient    HPI Phillip Lopez is a 22 y.o. male.   Patient presents for concerns of persistent allergy related symptoms.  Patient reports that his allergies have been persistent since the beginning of the year.  He is not sure the exact trigger.  He used to work for [employer] but no longer does.  He reports he has had a lot of sneezing, nasal congestion, itchy and watery eyes.  His eye symptoms started worsening over the past week or so.  Denies trauma or foreign body to the eyes.  Patient is not reporting the use of any corrective lenses or any blurry vision.  Has been taking Zyrtec daily with no improvement in symptoms. Denies cough.   Patient has a legal guardian banner on his chart.  He is adamant that he does not have a legal guardian.  Reports that he was in the foster care system when he was under the age of 64 so this may be related to this.  States that he makes all of his medical and legal decisions.   Allergic Reaction   Past Medical History:  Diagnosis Date   Asthma    Environmental allergies     Patient Active Problem List   Diagnosis Date Noted   OVERWEIGHT 03/02/2009   ASTHMA 08/20/2007   SINUSITIS-CHRONIC 11/10/2006   ALLERGIC RHINITIS 07/21/2006    Past Surgical History:  Procedure Laterality Date   NERVE REPAIR  01/27/2022   Procedure: NERVE REPAIR;  Surgeon: Mack Hook, MD;  Location: [facility];  Service: Orthopedics;;   OPEN REDUCTION INTERNAL FIXATION (ORIF) METACARPAL Right 01/27/2022   Procedure: OPEN TREATMENT OF RIGHT 2ND METACARPAL FRACTURE;  Surgeon: Mack Hook, MD;  Location: [facility];  Service: Orthopedics;  Laterality: Right;  WITH PREOP REGIONAL BLOCK   TONSILLECTOMY AND ADENOIDECTOMY          Home Medications    Prior to Admission medications   Medication Sig Start Date End Date Taking? Authorizing Provider  fluticasone (FLONASE) 50 MCG/ACT nasal spray Place 1 spray into both nostrils daily. 07/18/22  Yes Doni Widmer, Acie Fredrickson, FNP  levocetirizine (XYZAL) 5 MG tablet Take 1 tablet (5 mg total) by mouth every evening. 07/18/22  Yes Daphane Odekirk, Rolly Salter E, FNP  olopatadine (PATANOL) 0.1 % ophthalmic solution Place 1 drop into both eyes 2 (two) times daily. 07/18/22  Yes Kayal Mula, Rolly Salter E, FNP  cephALEXin (KEFLEX) 500 MG capsule Take 1 capsule (500 mg total) by mouth 4 (four) times daily. 01/25/22   Derwood Kaplan, MD  doxycycline (VIBRAMYCIN) 100 MG capsule Take 1 capsule (100 mg total) by mouth 2 (two) times daily. 01/25/22   Derwood Kaplan, MD  lidocaine (LIDODERM) 5 % Place 1 patch onto the skin daily. Remove & Discard patch within 12 hours or as directed by MD 11/17/21   Nicanor Alcon, April, MD  naproxen (NAPROSYN) 500 MG tablet Take 1 tablet (500 mg total) by mouth 2 (two) times daily with a meal. 11/17/21   Palumbo, April, MD  oxyCODONE-acetaminophen (PERCOCET/ROXICET) 5-325 MG tablet Take 1 tablet by mouth every 8 (eight) hours as needed for severe pain. 01/25/22   Derwood Kaplan, MD    Family History History reviewed. No pertinent family history.  Social History  Social History   Tobacco Use   Smoking status: Never   Smokeless tobacco: Never  Substance Use Topics   Alcohol use: No   Drug use: Yes    Types: Marijuana     Allergies   Patient has no known allergies.   Review of Systems Review of Systems Per HPI  Physical Exam Triage Vital Signs ED Triage Vitals [07/18/22 1300]  Enc Vitals Group     BP 134/87     Pulse Rate 67     Resp 18     Temp 98.4 F (36.9 C)     Temp Source Oral     SpO2 98 %     Weight      Height      Head Circumference      Peak Flow      Pain Score      Pain Loc      Pain Edu?      Excl. in GC?    No data found.  Updated Vital  Signs BP 134/87 (BP Location: Left Arm)   Pulse 67   Temp 98.4 F (36.9 C) (Oral)   Resp 18   SpO2 98%   Visual Acuity Right Eye Distance:   Left Eye Distance:   Bilateral Distance:    Right Eye Near:   Left Eye Near:    Bilateral Near:     Physical Exam Constitutional:      General: He is not in acute distress.    Appearance: Normal appearance. He is not toxic-appearing or diaphoretic.  HENT:     Head: Normocephalic and atraumatic.     Right Ear: Tympanic membrane and ear canal normal.     Left Ear: Tympanic membrane and ear canal normal.     Nose: Congestion present.     Mouth/Throat:     Mouth: Mucous membranes are moist.     Pharynx: No posterior oropharyngeal erythema.  Eyes:     General: Lids are normal. Lids are everted, no foreign bodies appreciated. Vision grossly intact. Gaze aligned appropriately.     Extraocular Movements: Extraocular movements intact.     Conjunctiva/sclera:     Right eye: Right conjunctiva is injected. No chemosis, exudate or hemorrhage.    Left eye: Left conjunctiva is injected. No chemosis, exudate or hemorrhage.    Pupils: Pupils are equal, round, and reactive to light.  Cardiovascular:     Rate and Rhythm: Normal rate and regular rhythm.     Pulses: Normal pulses.     Heart sounds: Normal heart sounds.  Pulmonary:     Effort: Pulmonary effort is normal. No respiratory distress.     Breath sounds: Normal breath sounds. No wheezing.  Abdominal:     General: Abdomen is flat. Bowel sounds are normal.     Palpations: Abdomen is soft.  Musculoskeletal:        General: Normal range of motion.     Cervical back: Normal range of motion.  Skin:    General: Skin is warm and dry.  Neurological:     General: No focal deficit present.     Mental Status: He is alert and oriented to person, place, and time. Mental status is at baseline.  Psychiatric:        Mood and Affect: Mood normal.        Behavior: Behavior normal.      UC  Treatments / Results  Labs (all labs ordered are listed, but only abnormal results are  displayed) Labs Reviewed - No data to display  EKG   Radiology No results found.  Procedures Procedures (including critical care time)  Medications Ordered in UC Medications - No data to display  Initial Impression / Assessment and Plan / UC Course  I have reviewed the triage vital signs and the nursing notes.  Pertinent labs & imaging results that were available during my care of the patient were reviewed by me and considered in my medical decision making (see chart for details).     Suspect allergic rhinitis and allergic conjunctivitis.  Visual acuity appears normal.  Patient to stop Zyrtec.  Will start Xyzal in the evenings.  Patanol eyedrops also prescribed to help alleviate eye symptoms.  Flonase also prescribed for patient.  Advised patient to follow-up with asthma and allergy specialist for further evaluation and management.  Advised strict return precautions.  Patient verbalized understanding and was agreeable with plan. Final Clinical Impressions(s) / UC Diagnoses   Final diagnoses:  Allergic rhinitis, unspecified seasonality, unspecified trigger  Allergic conjunctivitis of both eyes     Discharge Instructions      Stop taking Zyrtec.  Start Xyzal daily.  I have also prescribed eyedrops and nasal spray.  Follow-up with asthma and allergy specialist if symptoms persist or worsen.    ED Prescriptions     Medication Sig Dispense Auth. Provider   levocetirizine (XYZAL) 5 MG tablet Take 1 tablet (5 mg total) by mouth every evening. 30 tablet [place], [place], [place]   olopatadine (PATANOL) 0.1 % ophthalmic solution Place 1 drop into both eyes 2 (two) times daily. 5 mL Jaylyne Breese, Rolly Salter E, FNP   fluticasone [facility]) 50 MCG/ACT nasal spray Place 1 spray into both nostrils daily. 16 g Gustavus Bryant, [place]      PDMP not reviewed this encounter.   Gustavus Bryant, [place] 07/18/22 1334     Gustavus Bryant, [place] 07/18/22 1335

## 2022-07-18 NOTE — Discharge Instructions (Signed)
Stop taking Zyrtec.  Start Xyzal daily.  I have also prescribed eyedrops and nasal spray.  Follow-up with asthma and allergy specialist if symptoms persist or worsen.

## 2022-07-18 NOTE — ED Triage Notes (Signed)
Pt states he is taking Zyrtec for his allergies and he is still sneezing,coughing and itchy. Pt would like to know what else he can take.

## 2022-09-24 ENCOUNTER — Ambulatory Visit: Payer: Medicaid Other | Admitting: Allergy

## 2023-04-15 ENCOUNTER — Ambulatory Visit
Admission: RE | Admit: 2023-04-15 | Discharge: 2023-04-15 | Disposition: A | Discharge status: Home / Self Care | Payer: Medicaid Other | Source: Ambulatory Visit | Attending: Physician Assistant | Admitting: Physician Assistant

## 2023-04-15 VITALS — BP 154/91 | HR 50 | Temp 97.3°F | Resp 16

## 2023-04-15 DIAGNOSIS — L309 Dermatitis, unspecified: Secondary | ICD-10-CM | POA: Diagnosis not present

## 2023-04-15 DIAGNOSIS — Q809 Congenital ichthyosis, unspecified: Secondary | ICD-10-CM | POA: Diagnosis not present

## 2023-04-15 HISTORY — DX: Dermatitis, unspecified: L30.9

## 2023-04-15 MED ORDER — TRIAMCINOLONE ACETONIDE 0.1 % EX OINT: TOPICAL_OINTMENT | Freq: Every day | CUTANEOUS | 1 refills | Status: DC | PRN

## 2023-04-15 NOTE — ED Triage Notes (Addendum)
 Pt reports eczema flare up x2 weeks. Reports burning sensation to all areas - spread up legs, chest, back, and face (eyelids). Pt reports little relief at home with lotion and using aquaphor on eyelid area. Reports pain and discomfort gets worse with walking and movement as areas rub.

## 2023-04-15 NOTE — ED Provider Notes (Signed)
 [facility]    CSN: [id number] Arrival date & time: 04/15/23  1356      History   Chief Complaint Chief Complaint  Patient presents with   Rash    HPI Phillip Lopez is a 23 y.o. male.   Patient presents today with a several week history of a rash involving his trunk, upper legs, upper arms.  He denies any changes to personal hygiene products including soaps or detergents.  He does use Dove on a regular basis and tries to moisturize these areas.  He does typically take 2-3 showers per day.  Denies any recent medication changes.  He denies formal diagnosis of eczema but believes that that is the cause of his symptoms.  He has never seen a dermatologist but did call them to schedule an appointment because symptoms are bothersome to him but they requested a referral.  He does not currently have a primary care and is requesting that we put in a referral that he can see them if his symptoms do not improve with her current medication regiment.  Denies any recent antibiotics or steroids.  He denies history of psoriasis or other dermatological condition.  He does report some pruritus but denies any associated pain, fever, nausea, vomiting, spread of rash.    Past Medical History:  Diagnosis Date   Asthma    Eczema    Environmental allergies     Patient Active Problem List   Diagnosis Date Noted   Overweight 03/02/2009   Asthma 08/20/2007   Sinusitis, chronic 11/10/2006   Allergic rhinitis 07/21/2006    Past Surgical History:  Procedure Laterality Date   NERVE REPAIR  01/27/2022   Procedure: NERVE REPAIR;  Surgeon: Rober Chimera, MD;  Location: [facility];  Service: Orthopedics;;   OPEN REDUCTION INTERNAL FIXATION (ORIF) METACARPAL Right 01/27/2022   Procedure: OPEN TREATMENT OF RIGHT 2ND METACARPAL FRACTURE;  Surgeon: Rober Chimera, MD;  Location: [facility];  Service: Orthopedics;  Laterality: Right;  WITH PREOP REGIONAL BLOCK    TONSILLECTOMY AND ADENOIDECTOMY         Home Medications    Prior to Admission medications   Medication Sig Start Date End Date Taking? Authorizing Provider  triamcinolone  0.1% oint-Eucerin equivalent cream 1:1 mixture Apply topically daily as needed. 04/15/23  Yes Prestyn Stanco K, PA-C  fluticasone  (FLONASE ) 50 MCG/ACT nasal spray Place 1 spray into both nostrils daily. 07/18/22   Dodson Freestone, FNP  olopatadine  (PATANOL) 0.1 % ophthalmic solution Place 1 drop into both eyes 2 (two) times daily. Patient not taking: Reported on 04/15/2023 07/18/22   Dodson Freestone, FNP    Family History History reviewed. No pertinent family history.  Social History Social History   Tobacco Use   Smoking status: Never   Smokeless tobacco: Never  Vaping Use   Vaping status: Never Used  Substance Use Topics   Alcohol use: No   Drug use: Yes    Types: Marijuana     Allergies   Patient has no known allergies.   Review of Systems Review of Systems  Constitutional:  Positive for activity change. Negative for appetite change, fatigue and fever.  Gastrointestinal:  Negative for abdominal pain, diarrhea, nausea and vomiting.  Musculoskeletal:  Negative for arthralgias and myalgias.  Skin:  Positive for rash.     Physical Exam Triage Vital Signs ED Triage Vitals  Encounter Vitals Group     BP 04/15/23 1427 (!) 154/91     Systolic  BP Percentile --      Diastolic BP Percentile --      Pulse Rate 04/15/23 1427 (!) 50     Resp 04/15/23 1427 16     Temp 04/15/23 1427 (!) 97.3 F (36.3 C)     Temp Source 04/15/23 1427 Oral     SpO2 04/15/23 1427 98 %     Weight --      Height --      Head Circumference --      Peak Flow --      Pain Score 04/15/23 1431 4     Pain Loc --      Pain Education --      Exclude from Growth Chart --    No data found.  Updated Vital Signs BP (!) 154/91 (BP Location: Left Arm)   Pulse (!) 50   Temp (!) 97.3 F (36.3 C) (Oral)   Resp 16   SpO2 98%    Visual Acuity Right Eye Distance:   Left Eye Distance:   Bilateral Distance:    Right Eye Near:   Left Eye Near:    Bilateral Near:     Physical Exam Vitals reviewed.  Constitutional:      General: He is awake.     Appearance: Normal appearance. He is well-developed. He is not ill-appearing.     Comments: Very pleasant male appears stated age in no acute distress sitting comfortably in exam room  HENT:     Head: Normocephalic and atraumatic.     Mouth/Throat:     Pharynx: No oropharyngeal exudate, posterior oropharyngeal erythema or uvula swelling.  Cardiovascular:     Rate and Rhythm: Normal rate and regular rhythm.     Heart sounds: Normal heart sounds, S1 normal and S2 normal. No murmur heard. Pulmonary:     Effort: Pulmonary effort is normal.     Breath sounds: Normal breath sounds. No stridor. No wheezing, rhonchi or rales.     Comments: Clear to auscultation bilaterally Abdominal:     Palpations: Abdomen is soft.     Tenderness: There is no abdominal tenderness.  Skin:    Findings: Rash present. Rash is macular and papular.     Comments: Areas of maculopapular rash noted upper legs, trunk, upper arms with evidence of excoriation.  Neurological:     Mental Status: He is alert.  Psychiatric:        Behavior: Behavior is cooperative.      UC Treatments / Results  Labs (all labs ordered are listed, but only abnormal results are displayed) Labs Reviewed - No data to display  EKG   Radiology No results found.  Procedures Procedures (including critical care time)  Medications Ordered in UC Medications - No data to display  Initial Impression / Assessment and Plan / UC Course  I have reviewed the triage vital signs and the nursing notes.  Pertinent labs & imaging results that were available during my care of the patient were reviewed by me and considered in my medical decision making (see chart for details).     Patient is well-appearing, afebrile,  nontoxic, nontachycardic.  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  I suspect this is related to the cold dry air and repetitive showering.  Recommended that he decrease showering to once daily and use hypoallergenic soaps and detergents.  He was given triamcinolone /Eucerin combination to apply to specific areas.  Discussed that if  his symptoms are not improving and is reasonable to follow-up with dermatology and a referral was placed.  Discussed that we do not have someone to work referrals so if they need any additional information he would need to follow-up with primary care to have another referral placed.  Discussed that if anything worsens or changes and he has bit of rash, pain, fever, nausea, vomiting he needs to be seen immediately.  Strict return precautions given.  Final Clinical Impressions(s) / UC Diagnoses   Final diagnoses:  Xerodermia  Dermatitis     Discharge Instructions      I have placed a referral to dermatology.  They should contact you to schedule an appointment.  We do not have anyone to work referral so if they require additional information we will not be able to provide up to them but you can follow-up with primary care to see if they can also place a referral.  Use Eucerin/triamcinolone  combination on affected areas as needed daily.  Use hypoallergenic soaps and detergents including an emollient/moisturizer such as Aquaphor or Eucerin.  If anything changes or worsens please return for reevaluation.     ED Prescriptions     Medication Sig Dispense Auth. Provider   triamcinolone  0.1% oint-Eucerin equivalent cream 1:1 mixture Apply topically daily as needed. 90 g Tashira Torre K, PA-C      PDMP not reviewed this encounter.   Budd Cargo, PA-C 04/15/23 1458

## 2023-04-15 NOTE — Discharge Instructions (Signed)
 I have placed a referral to dermatology.  They should contact you to schedule an appointment.  We do not have anyone to work referral so if they require additional information we will not be able to provide up to them but you can follow-up with primary care to see if they can also place a referral.  Use Eucerin/triamcinolone  combination on affected areas as needed daily.  Use hypoallergenic soaps and detergents including an emollient/moisturizer such as Aquaphor or Eucerin.  If anything changes or worsens please return for reevaluation.

## 2023-04-16 ENCOUNTER — Telehealth: Payer: Self-pay

## 2023-04-17 ENCOUNTER — Telehealth: Payer: Self-pay

## 2023-04-17 MED ORDER — TRIAMCINOLONE ACETONIDE 0.1 % EX OINT: TOPICAL_OINTMENT | Freq: Every day | CUTANEOUS | 1 refills | Status: AC | PRN

## 2023-04-17 NOTE — Telephone Encounter (Signed)
Incoming call/information:  phone call message for yall... mrn [phone] Phillip Lopez was seen 1/15 and was prescribed an ointment. He said that he needs one already premixed because that's what the pharmacy has ready apparently. He also said he called yesterday and nothing has been done.Marland KitchenMarland KitchenMarland KitchenPhone number [phone]  Outgoing call:  Patient states he called yesterday to have the Rx sent to compound pharmacy or send as separate ingredient rx''s with no response.  After discussion and review of note I have called the pharmacy below who will fill it as written immediately and contact patient, patient notified by phone as well.  [facility] Spoke with "Asia"& "Ed" who confirmed Rx can be filled. Address: [address], [place], [place] [address] Hours:  Open ? Closes 7?PM Phone: [phone]  E-Prescribed (to pharmacy above).   Yancey Flemings CMA

## 2023-05-05 ENCOUNTER — Other Ambulatory Visit: Payer: Self-pay

## 2023-05-05 ENCOUNTER — Ambulatory Visit: Payer: Medicaid Other | Admitting: Internal Medicine

## 2023-05-05 ENCOUNTER — Encounter: Payer: Self-pay | Admitting: Internal Medicine

## 2023-05-05 VITALS — BP 110/66 | HR 46 | Temp 98.3°F | Ht 67.0 in | Wt 178.6 lb

## 2023-05-05 DIAGNOSIS — L308 Other specified dermatitis: Secondary | ICD-10-CM | POA: Diagnosis not present

## 2023-05-05 DIAGNOSIS — J3089 Other allergic rhinitis: Secondary | ICD-10-CM

## 2023-05-05 MED ORDER — TRIAMCINOLONE ACETONIDE 0.1 % EX OINT: TOPICAL_OINTMENT | CUTANEOUS | 3 refills | Status: AC

## 2023-05-05 NOTE — Patient Instructions (Addendum)
 Dermatis  - Do a daily soaking tub bath in warm water for 10-15 minutes.  - Use a gentle, unscented cleanser at the end of the bath (such as Dove unscented bar or baby wash, or Aveeno sensitive body wash). Then rinse, pat half-way dry, and apply a gentle, unscented moisturizer cream or ointment (Cerave, Cetaphil, Eucerin, Aveeno, Aquaphor, Vanicream, Vaseline)  all over while still damp. Dry skin makes the itching and rash worse. The skin should be moisturized with a gentle, unscented moisturizer at least twice daily.  - Use only unscented liquid laundry detergent. - Apply prescribed topical steroid (triamcinolone  0.1% below neck ) to flared areas (red and thickened eczema) after the moisturizer has soaked into the skin (wait at least 30 minutes). Taper off the topical steroids as the skin improves. Do not use topical steroid for more than 10 days at a time.  - Follow up with Dermatology.     Other Allergic Rhinitis: - Hold all anti-histamines (Xyzal , Allegra, Zyrtec , Claritin , Benadryl , Pepcid) 3 days prior to next visit.    Follow up: 2/11 at 8:30 AM for skin testing 1-55

## 2023-05-05 NOTE — Progress Notes (Signed)
 NEW PATIENT  Date of Service/Encounter:  05/05/23  Consult requested by: Pcp, No   Subjective:   Phillip Lopez (DOB: 2000-05-31) is a 23 y.o. male who presents to the clinic on 05/05/2023 with a chief complaint of Allergies (Allergic to pollen), Eczema (Dried skin), and Establish Care .    History obtained from: chart review and patient.   Rhinitis:  Started about a year ago.  Symptoms include: nasal congestion, rhinorrhea, post nasal drainage, and sneezing, red eyes   Occurs seasonally-Spring  Potential triggers: pollens   Treatments tried:  Benadryl  PRN  Previous allergy  testing: no History of sinus surgery: no Nonallergic triggers: none   Rashes:   Had mild eczema in infancy.  Has had trouble the past 1 year with break outs on legs and arms.  They are not itchy but they burn.  Showers about 2-3 times a day and notices they burn worse.  Topical steroids given by ED are helping.  Uses Eucerin to moisturize and dove sensitive for soap.   Sleep is not affected  Reviewed:  04/15/2023: seen in ED for itching/rash.  No prior dx of eczema.  Maculopapular rash on exam.  Discussed use of topical triamcinolone  and moisturizing.    07/18/2022: seen in urgent care for sneezing, congestion, itchy watery eyes.  Discussed use of Flonase  and Xyzal .   3/22/20216: seen in urgent care for nasal pain, sore throat, palpitations.  EKG normal.  Discussed likely also has allergies, use Flonase /Claritin .   Past Medical History: Past Medical History:  Diagnosis Date   Asthma    Eczema    Environmental allergies    Past Surgical History: Past Surgical History:  Procedure Laterality Date   NERVE REPAIR  01/27/2022   Procedure: NERVE REPAIR;  Surgeon: Sebastian Lenis, MD;  Location: [facility];  Service: Orthopedics;;   OPEN REDUCTION INTERNAL FIXATION (ORIF) METACARPAL Right 01/27/2022   Procedure: OPEN TREATMENT OF RIGHT 2ND METACARPAL FRACTURE;  Surgeon: Sebastian Lenis, MD;  Location: [facility];  Service: Orthopedics;  Laterality: Right;  WITH PREOP REGIONAL BLOCK   TONSILLECTOMY AND ADENOIDECTOMY      Family History: History reviewed. No pertinent family history.  Social History:  Flooring in bedroom: tile Pets: dog and cat Tobacco use/exposure: vaping 2016-2023 Job: selector   Medication List:  Allergies as of 05/05/2023   No Known Allergies      Medication List        Accurate as of May 05, 2023 12:46 PM. If you have any questions, ask your nurse or doctor.          fluticasone  50 MCG/ACT nasal spray Commonly known as: FLONASE  Place 1 spray into both nostrils daily.   olopatadine  0.1 % ophthalmic solution Commonly known as: PATANOL Place 1 drop into both eyes 2 (two) times daily.   triamcinolone  0.1% oint-Eucerin equivalent cream 1:1 mixture Apply topically daily as needed.   triamcinolone  ointment 0.1 % Commonly known as: KENALOG  Apply twice daily for flare ups below neck, maximum 10 days. Started by: Arleta SHAUNNA Blanch         REVIEW OF SYSTEMS: Pertinent positives and negatives discussed in HPI.   Objective:   Physical Exam: BP 110/66 (BP Location: Left Arm, Patient Position: Sitting)   Pulse (!) 46   Temp 98.3 F (36.8 C) (Temporal)   Ht 5' 7 (1.702 m)   Wt 178 lb 9.6 oz (81 kg)   SpO2 98%   BMI 27.97 kg/m  Body  mass index is 27.97 kg/m. GEN: alert, well developed HEENT: clear conjunctiva, TM grey and translucent, nose with + mild inferior turbinate hypertrophy, pink nasal mucosa, slight clear rhinorrhea, no cobblestoning HEART: regular rate and rhythm, no murmur LUNGS: clear to auscultation bilaterally, no coughing, unlabored respiration ABDOMEN: soft, non distended  SKIN: few pink macular patches on bilateral upper thigh   Assessment:   1. Other eczema   2. Other allergic rhinitis     Plan/Recommendations:  Eczema  - Take pictures/videos of rashes with initial appearance.  Reports burning with rashes, some response to topical steroids, possibly nummular eczema? - Do a daily soaking tub bath in warm water for 10-15 minutes.  - Use a gentle, unscented cleanser at the end of the bath (such as Dove unscented bar or baby wash, or Aveeno sensitive body wash). Then rinse, pat half-way dry, and apply a gentle, unscented moisturizer cream or ointment (Cerave, Cetaphil, Eucerin, Aveeno, Aquaphor, Vanicream, Vaseline)  all over while still damp. Dry skin makes the itching and rash worse. The skin should be moisturized with a gentle, unscented moisturizer at least twice daily.  - Use only unscented liquid laundry detergent. - Apply prescribed topical steroid (triamcinolone  0.1% below neck ) to flared areas (red and thickened eczema) after the moisturizer has soaked into the skin (wait at least 30 minutes). Taper off the topical steroids as the skin improves. Do not use topical steroid for more than 7-10 days at a time.  - Follow up with Dermatology.     Other Allergic Rhinitis: - Due to turbinate hypertrophy, seasonal symptoms and unresponsive to over the counter meds, will perform skin testing to identify aeroallergen triggers.   - Use nasal saline rinses before nose sprays such as with Neilmed Sinus Rinse.  Use distilled water.   - Hold all anti-histamines (Xyzal , Allegra, Zyrtec , Claritin , Benadryl , Pepcid) 3 days prior to next visit.    Follow up: 2/11 at 8:30 AM for skin testing 1-55   Arleta Blanch, MD [facility] 

## 2023-05-12 ENCOUNTER — Ambulatory Visit: Payer: Medicaid Other | Admitting: Internal Medicine

## 2023-05-19 ENCOUNTER — Ambulatory Visit (INDEPENDENT_AMBULATORY_CARE_PROVIDER_SITE_OTHER): Payer: Medicaid Other | Admitting: Internal Medicine

## 2023-05-19 DIAGNOSIS — J3081 Allergic rhinitis due to animal (cat) (dog) hair and dander: Secondary | ICD-10-CM | POA: Diagnosis not present

## 2023-05-19 DIAGNOSIS — J301 Allergic rhinitis due to pollen: Secondary | ICD-10-CM | POA: Diagnosis not present

## 2023-05-19 MED ORDER — FLUTICASONE PROPIONATE 50 MCG/ACT NA SUSP: 2.0000 | Freq: Every day | NASAL | 5 refills | Status: DC

## 2023-05-19 MED ORDER — CETIRIZINE HCL 10 MG PO TABS: 10.0000 mg | ORAL_TABLET | Freq: Every day | ORAL | 5 refills | Status: DC

## 2023-05-19 MED ORDER — AZELASTINE HCL 0.1 % NA SOLN: 2.0000 | Freq: Two times a day (BID) | NASAL | 5 refills | Status: DC | PRN

## 2023-05-19 MED ORDER — CETIRIZINE HCL 10 MG PO TABS: 10.0000 mg | ORAL_TABLET | Freq: Every day | ORAL | 5 refills | Status: AC

## 2023-05-19 MED ORDER — FLUTICASONE PROPIONATE 50 MCG/ACT NA SUSP: 2.0000 | Freq: Every day | NASAL | 5 refills | Status: AC

## 2023-05-19 MED ORDER — AZELASTINE HCL 0.1 % NA SOLN: 2.0000 | Freq: Two times a day (BID) | NASAL | 5 refills | Status: AC | PRN

## 2023-05-19 NOTE — Patient Instructions (Addendum)
 Allergic Rhinitis: - Positive skin test 05/2023: trees, grasses, weeds, cats  - Avoidance measures discussed. - Use nasal saline rinses before nose sprays such as with Neilmed Sinus Rinse.  Use distilled water.   - Use Flonase 2 sprays each nostril daily. Aim upward and outward. - Use Azelastine 1-2 sprays each nostril twice daily as needed for runny nose, drainage, sneezing, congestion. Aim upward and outward. - Use Zyrtec 10 mg or Xyzal 5mg  or Allegra 180mg  daily.   - Consider allergy shots as long term control of your symptoms by teaching your immune system to be more tolerant of your allergy triggers. Discuss with insurance company regarding cost and coverage.  If wanting to start shots, call us and let us know and we can start the shots.   Dermatis  - Do a daily soaking tub bath in warm water for 10-15 minutes.  - Use a gentle, unscented cleanser at the end of the bath (such as Dove unscented bar or baby wash, or Aveeno sensitive body wash). Then rinse, pat half-way dry, and apply a gentle, unscented moisturizer cream or ointment (Cerave, Cetaphil, Eucerin, Aveeno, Aquaphor, Vanicream, Vaseline)  all over while still damp. Dry skin makes the itching and rash worse. The skin should be moisturized with a gentle, unscented moisturizer at least twice daily.  - Use only unscented liquid laundry detergent. - Apply prescribed topical steroid (triamcinolone 0.1% below neck ) to flared areas (red and thickened eczema) after the moisturizer has soaked into the skin (wait at least 30 minutes). Taper off the topical steroids as the skin improves. Do not use topical steroid for more than 10 days at a time.  - Follow up with Dermatology.      ALLERGEN AVOIDANCE MEASURES  Pollen Avoidance Pollen levels are highest during the mid-day and afternoon.  Consider this when planning outdoor activities. Avoid being outside when the grass is being mowed, or wear a mask if the pollen-allergic person must be the one  to mow the grass. Keep the windows closed to keep pollen outside of the home. Use an air conditioner to filter the air. Take a shower, wash hair, and change clothing after working or playing outdoors during pollen season. Pet Dander- Cats  Keep the pet out of your bedroom and restrict it to only a few rooms. Be advised that keeping the pet in only one room will not limit the allergens to that room. Don't pet, hug or kiss the pet; if you do, wash your hands with soap and water. High-efficiency particulate air (HEPA) cleaners run continuously in a bedroom or living room can reduce allergen levels over time. Regular use of a high-efficiency vacuum cleaner or a central vacuum can reduce allergen levels. Giving your pet a bath at least once a week can reduce airborne allergen.

## 2023-05-19 NOTE — Progress Notes (Signed)
 FOLLOW UP Date of Service/Encounter:  05/19/23   Subjective:  Phillip Lopez (DOB: 16-Jan-2001) is a 23 y.o. male [facility] for follow up for skin testing.   History obtained from: chart review and patient.  Anti histamines held.   Past Medical History: Past Medical History:  Diagnosis Date   Asthma    Eczema    Environmental allergies     Objective:  There were no vitals taken for this visit. There is no height or weight on file to calculate BMI. Physical Exam: GEN: alert, well developed HEENT: clear conjunctiva, MMM LUNGS: unlabored respiration  Skin Testing:  Skin prick testing was placed, which includes aeroallergens/foods, histamine control, and saline control.  Verbal consent was obtained prior to placing test.  Patient tolerated procedure well.  Allergy testing results were read and interpreted by myself, documented by clinical staff. Adequate positive and negative control.  Positive results to:  Results discussed with patient/family.  Airborne Adult Perc - 05/19/23 0940     Time Antigen Placed 0940    Allergen Manufacturer Waynette Buttery    Location Back    Number of Test 55    Panel 1 Select    1. Control-Buffer 50% Glycerol Negative    2. Control-Histamine 3+    3. Bahia Negative    4. French Southern Territories Negative    5. Johnson Negative    6. [place] Blue 3+    7. Meadow Fescue 3+    8. Perennial Rye 3+    9. Timothy 2+    10. Ragweed Mix Negative    11. Cocklebur Negative    12. Plantain,  English 3+    13. Baccharis Negative    14. Dog Fennel Negative    15. Russian Thistle Negative    16. Lamb's Quarters 3+    17. Sheep Sorrell Negative    18. Rough Pigweed Negative    19. Marsh Elder, Rough Negative    20. Mugwort, Common Negative    21. Box, Elder Negative    22. Cedar, red Negative    23. Sweet Gum Negative    24. Pecan Pollen 3+    25. Pine Mix 2+    26. Walnut, Black Pollen 3+    27. Red Mulberry  Negative    28. Ash Mix Negative    29. Birch Mix Negative    30. Beech American 3+    31. Cottonwood, [place] Negative    32. Hickory, White 3+    33. Maple Mix Negative    34. Oak, [place] Mix 3+    35. Sycamore Eastern Negative    36. Alternaria Alternata Negative    37. Cladosporium Herbarum Negative    38. Aspergillus Mix Negative    39. Penicillium Mix Negative    40. Bipolaris Sorokiniana (Helminthosporium) Negative    41. Drechslera Spicifera (Curvularia) Negative    42. Mucor Plumbeus Negative    43. Fusarium Moniliforme Negative    44. Aureobasidium Pullulans (pullulara) Negative    45. Rhizopus Oryzae Negative    46. Botrytis Cinera Negative    47. Epicoccum Nigrum Negative    48. Phoma Betae Negative    49. Dust Mite Mix Negative    50. Cat Hair 10,000 BAU/ml 3+    51.  Dog Epithelia Negative    52. Mixed Feathers Negative    53. Horse Epithelia Negative    54. Cockroach, German Negative    55. Tobacco Leaf Negative  Intradermal - 05/19/23 1045     Time Antigen Placed 1045    Allergen Manufacturer Greer    Location Arm    Number of Test 12    Intradermal Select    Control Negative    Bahia 3+    French Southern Territories Negative    Johnson 2+    Ragweed Mix Negative    Mold 1 Negative    Mold 2 Negative    Mold 3 Negative    Mold 4 Negative    Mite Mix Negative    Dog Negative    Cockroach Negative              Assessment:   1. Seasonal allergic rhinitis due to pollen   2. Allergic rhinitis due to animal hair or dander     Plan/Recommendations:  Allergic Rhinitis: - Due to turbinate hypertrophy, seasonal symptoms and unresponsive to over the counter meds, will perform skin testing to identify aeroallergen triggers.   - Positive skin test 05/2023: trees, grasses, weeds, cats  - Avoidance measures discussed. - Use nasal saline rinses before nose sprays such as with Neilmed Sinus Rinse.  Use distilled water.   - Use Flonase 2 sprays each  nostril daily. Aim upward and outward. - Use Azelastine 1-2 sprays each nostril twice daily as needed for runny nose, drainage, sneezing, congestion. Aim upward and outward. - Use Zyrtec 10 mg or Xyzal 5mg  or Allegra 180mg  daily.   - Consider allergy shots as long term control of your symptoms by teaching your immune system to be more tolerant of your allergy triggers. Discuss with insurance company regarding cost and coverage.  If wanting to start shots, call us and let us know and we can start the shots.   Dermatis  - Do a daily soaking tub bath in warm water for 10-15 minutes.  - Use a gentle, unscented cleanser at the end of the bath (such as Dove unscented bar or baby wash, or Aveeno sensitive body wash). Then rinse, pat half-way dry, and apply a gentle, unscented moisturizer cream or ointment (Cerave, Cetaphil, Eucerin, Aveeno, Aquaphor, Vanicream, Vaseline)  all over while still damp. Dry skin makes the itching and rash worse. The skin should be moisturized with a gentle, unscented moisturizer at least twice daily.  - Use only unscented liquid laundry detergent. - Apply prescribed topical steroid (triamcinolone 0.1% below neck ) to flared areas (red and thickened eczema) after the moisturizer has soaked into the skin (wait at least 30 minutes). Taper off the topical steroids as the skin improves. Do not use topical steroid for more than 10 days at a time.  - Follow up with Dermatology.       ALLERGEN AVOIDANCE MEASURES  Pollen Avoidance Pollen levels are highest during the mid-day and afternoon.  Consider this when planning outdoor activities. Avoid being outside when the grass is being mowed, or wear a mask if the pollen-allergic person must be the one to mow the grass. Keep the windows closed to keep pollen outside of the home. Use an air conditioner to filter the air. Take a shower, wash hair, and change clothing after working or playing outdoors during pollen season. Pet Dander-  Cats  Keep the pet out of your bedroom and restrict it to only a few rooms. Be advised that keeping the pet in only one room will not limit the allergens to that room. Don't pet, hug or kiss the pet; if you do, wash your hands with soap and water.  High-efficiency particulate air (HEPA) cleaners run continuously in a bedroom or living room can reduce allergen levels over time. Regular use of a high-efficiency vacuum cleaner or a central vacuum can reduce allergen levels. Giving your pet a bath at least once a week can reduce airborne allergen.    Return in about 2 months (around 07/17/2023).  Alesia Morin, MD [facility]

## 2023-05-19 NOTE — Addendum Note (Signed)
 Addended byAlesia Morin on: 05/19/2023 11:48 AM   Modules accepted: Orders

## 2023-07-04 ENCOUNTER — Ambulatory Visit
Admission: EM | Admit: 2023-07-04 | Discharge: 2023-07-04 | Disposition: A | Discharge status: Home / Self Care | Attending: Nurse Practitioner | Admitting: Nurse Practitioner

## 2023-07-04 DIAGNOSIS — M25511 Pain in right shoulder: Secondary | ICD-10-CM

## 2023-07-04 MED ORDER — IBUPROFEN 800 MG PO TABS: 800.0000 mg | ORAL_TABLET | Freq: Three times a day (TID) | ORAL | 0 refills | Status: AC | PRN

## 2023-07-04 MED ORDER — CYCLOBENZAPRINE HCL 10 MG PO TABS: 10.0000 mg | ORAL_TABLET | Freq: Two times a day (BID) | ORAL | 0 refills | Status: DC | PRN

## 2023-07-04 NOTE — ED Provider Notes (Signed)
 [facility]    CSN: [id number] Arrival date & time: 07/04/23  1431      History   Chief Complaint Chief Complaint  Patient presents with   Shoulder Pain    HPI Phillip Lopez is a 23 y.o. male.   Phillip Lopez is a 23 year old male who presents with right shoulder pain. He reports that the pain began after helping someone unload a generator from a truck, during which he lifted it at an awkward angle. This resulted in pain localized to the posterior aspect of his right shoulder. He denies any numbness, tingling, weakness, loss of sensation, or loss of motion in the right upper extremity. He also denies any neck pain. The pain improved after taking Advil, and although it is better today, he still experiences tightness in the area when raising his arm. He called out of work today because his job requires heavy lifting and he did not feel able to perform those duties. However, he feels he will be able to return to work tomorrow without any restrictions and is requesting a return-to-work note.        Past Medical History:  Diagnosis Date   Asthma    Eczema    Environmental allergies     Patient Active Problem List   Diagnosis Date Noted   Overweight 03/02/2009   Asthma 08/20/2007   Sinusitis, chronic 11/10/2006   Allergic rhinitis 07/21/2006    Past Surgical History:  Procedure Laterality Date   NERVE REPAIR  01/27/2022   Procedure: NERVE REPAIR;  Surgeon: Mack Hook, MD;  Location: [facility];  Service: Orthopedics;;   OPEN REDUCTION INTERNAL FIXATION (ORIF) METACARPAL Right 01/27/2022   Procedure: OPEN TREATMENT OF RIGHT 2ND METACARPAL FRACTURE;  Surgeon: Mack Hook, MD;  Location: [facility];  Service: Orthopedics;  Laterality: Right;  WITH PREOP REGIONAL BLOCK   TONSILLECTOMY AND ADENOIDECTOMY         Home Medications    Prior to Admission medications   Medication Sig Start Date End Date Taking?  Authorizing Provider  cyclobenzaprine (FLEXERIL) 10 MG tablet Take 1 tablet (10 mg total) by mouth 2 (two) times daily as needed for muscle spasms. 07/04/23  Yes Lurline Idol, FNP  ibuprofen (ADVIL) 800 MG tablet Take 1 tablet (800 mg total) by mouth every 8 (eight) hours as needed (pain). 07/04/23  Yes Lurline Idol, FNP  azelastine (ASTELIN) 0.1 % nasal spray Place 2 sprays into both nostrils 2 (two) times daily as needed. Use in each nostril as directed 05/19/23   Birder Robson, MD  cetirizine (ZYRTEC ALLERGY) 10 MG tablet Take 1 tablet (10 mg total) by mouth daily. 05/19/23   Birder Robson, MD  fluticasone (FLONASE) 50 MCG/ACT nasal spray Place 2 sprays into both nostrils daily. 05/19/23   Birder Robson, MD  olopatadine (PATANOL) 0.1 % ophthalmic solution Place 1 drop into both eyes 2 (two) times daily. Patient not taking: Reported on 05/05/2023 07/18/22   Gustavus Bryant, FNP  triamcinolone 0.1% oint-Eucerin equivalent cream 1:1 mixture Apply topically daily as needed. 04/17/23   Raspet, Noberto Retort, PA-C  triamcinolone ointment (KENALOG) 0.1 % Apply twice daily for flare ups below neck, maximum 10 days. 05/05/23   Birder Robson, MD    Family History History reviewed. No pertinent family history.  Social History Social History   Tobacco Use   Smoking status: Never   Smokeless tobacco: Never  Vaping Use   Vaping status: Former  Substance  Use Topics   Alcohol use: No   Drug use: Not Currently    Types: Marijuana     Allergies   Patient has no known allergies.   Review of Systems Review of Systems  Musculoskeletal:  Positive for arthralgias. Negative for back pain, joint swelling, neck pain and neck stiffness.  Neurological:  Negative for weakness and numbness.  All other systems reviewed and are negative.    Physical Exam Triage Vital Signs ED Triage Vitals  Encounter Vitals Group     BP 07/04/23 1531 135/85     Systolic BP Percentile --      Diastolic BP Percentile --       Pulse Rate 07/04/23 1531 (!) 48     Resp 07/04/23 1531 16     Temp 07/04/23 1531 97.7 F (36.5 C)     Temp Source 07/04/23 1531 Oral     SpO2 07/04/23 1531 97 %     Weight --      Height --      Head Circumference --      Peak Flow --      Pain Score 07/04/23 1529 8     Pain Loc --      Pain Education --      Exclude from Growth Chart --    No data found.  Updated Vital Signs BP 135/85 (BP Location: Right Arm)   Pulse (!) 48   Temp 97.7 F (36.5 C) (Oral)   Resp 16   SpO2 97%   Visual Acuity Right Eye Distance:   Left Eye Distance:   Bilateral Distance:    Right Eye Near:   Left Eye Near:    Bilateral Near:     Physical Exam Vitals reviewed.  Constitutional:      General: He is not in acute distress.    Appearance: Normal appearance. He is normal weight. He is not toxic-appearing.  HENT:     Head: Normocephalic.     Mouth/Throat:     Mouth: Mucous membranes are moist.  Cardiovascular:     Rate and Rhythm: Normal rate.  Pulmonary:     Effort: Pulmonary effort is normal.  Musculoskeletal:        General: Normal range of motion.     Right shoulder: Tenderness present. No swelling, deformity, effusion, laceration, bony tenderness or crepitus. Normal range of motion. Normal strength.     Cervical back: Normal, full passive range of motion without pain, normal range of motion and neck supple. No pain with movement.  Skin:    General: Skin is warm and dry.  Neurological:     General: No focal deficit present.     Mental Status: He is alert and oriented to person, place, and time.      UC Treatments / Results  Labs (all labs ordered are listed, but only abnormal results are displayed) Labs Reviewed - No data to display  EKG   Radiology No results found.  Procedures Procedures (including critical care time)  Medications Ordered in UC Medications - No data to display  Initial Impression / Assessment and Plan / UC Course  I have reviewed the  triage vital signs and the nursing notes.  Pertinent labs & imaging results that were available during my care of the patient were reviewed by me and considered in my medical decision making (see chart for details).    23 year old male presenting with right shoulder pain after lifting a generator from a truck at an  awkward angle. He denies numbness, tingling, weakness, loss of sensation, or loss of motion in the right upper extremity. He also denies any neck pain. The pain has improved with Advil but he continues to experience tightness when raising his arm. The patient is alert, oriented, and in no acute distress. Physical exam is as documented and reveals no focal deficits. He is neurovascularly intact. Motrin and Flexeril were prescribed for pain and muscle tightness. Supportive care measures, including rest, ice/heat therapy, and activity modification, were reviewed. The patient was advised to follow up with his primary care provider or orthopedics if symptoms persist or worsen.  Today's evaluation has revealed no signs of a dangerous process. Discussed diagnosis with patient and/or guardian. Patient and/or guardian aware of their diagnosis, possible red flag symptoms to watch out for and need for close follow up. Patient and/or guardian understands verbal and written discharge instructions. Patient and/or guardian comfortable with plan and disposition.  Patient and/or guardian has a clear mental status at this time, good insight into illness (after discussion and teaching) and has clear judgment to make decisions regarding their care  Documentation was completed with the aid of voice recognition software. Transcription may contain typographical errors. Final Clinical Impressions(s) / UC Diagnoses   Final diagnoses:  Acute pain of right shoulder     Discharge Instructions      You were seen today for pain in your right shoulder, which is most likely caused by an acute muscle strain. Take the  medications prescribed to you as directed. Do not take any over-the-counter aspirin, Motrin, ibuprofen, or Aleve.  To help with the pain and inflammation, alternate between using ice and heat on the sore area several times a day. Always use a towel between the ice and your skin--do not place ice directly on your skin.  Avoid any heavy lifting or pulling with your right arm for the next few days while your shoulder heals. Give your body time to rest and recover. If your pain worsens or doesn't improve, follow up with your healthcare provider.     ED Prescriptions     Medication Sig Dispense Auth. Provider   cyclobenzaprine (FLEXERIL) 10 MG tablet Take 1 tablet (10 mg total) by mouth 2 (two) times daily as needed for muscle spasms. 10 tablet Lurline Idol, FNP   ibuprofen (ADVIL) 800 MG tablet Take 1 tablet (800 mg total) by mouth every 8 (eight) hours as needed (pain). 21 tablet Lurline Idol, FNP      PDMP not reviewed this encounter.   Lurline Idol, [place] 07/04/23 [phone]

## 2023-07-04 NOTE — Discharge Instructions (Addendum)
 You were seen today for pain in your right shoulder, which is most likely caused by an acute muscle strain. Take the medications prescribed to you as directed. Do not take any over-the-counter aspirin, Motrin, ibuprofen, or Aleve.  To help with the pain and inflammation, alternate between using ice and heat on the sore area several times a day. Always use a towel between the ice and your skin--do not place ice directly on your skin.  Avoid any heavy lifting or pulling with your right arm for the next few days while your shoulder heals. Give your body time to rest and recover. If your pain worsens or doesn't improve, follow up with your healthcare provider.

## 2023-07-04 NOTE — ED Triage Notes (Signed)
 Pt states he was moving heavy things yesterday and now having pain in his right shoulder.

## 2023-07-21 ENCOUNTER — Ambulatory Visit: Payer: Medicaid Other | Admitting: Internal Medicine

## 2023-09-05 ENCOUNTER — Ambulatory Visit
Admission: EM | Admit: 2023-09-05 | Discharge: 2023-09-05 | Disposition: A | Discharge status: Home / Self Care | Payer: Self-pay | Attending: Family Medicine | Admitting: Family Medicine

## 2023-09-05 DIAGNOSIS — M25511 Pain in right shoulder: Secondary | ICD-10-CM

## 2023-09-05 DIAGNOSIS — S46911A Strain of unspecified muscle, fascia and tendon at shoulder and upper arm level, right arm, initial encounter: Secondary | ICD-10-CM

## 2023-09-05 MED ORDER — CYCLOBENZAPRINE HCL 5 MG PO TABS: 5.0000 mg | ORAL_TABLET | Freq: Every evening | ORAL | 0 refills | Status: DC | PRN

## 2023-09-05 MED ORDER — NAPROXEN 375 MG PO TABS: 375.0000 mg | ORAL_TABLET | Freq: Two times a day (BID) | ORAL | 0 refills | Status: AC

## 2023-09-05 NOTE — ED Triage Notes (Addendum)
 Pt c/o pain to right shoulder after lifting a generator 2 days ago-NAD-steady gait

## 2023-09-05 NOTE — ED Provider Notes (Signed)
 [facility]  Note:  This document was prepared using Conservation officer, historic buildings and may include unintentional dictation errors.  MRN: [id number] DOB: 2000/05/31  Subjective:   Phillip Lopez is a 23 y.o. male presenting for 2-day history of persistent right shoulder pain.  Patient was standing over a generator raised 4 feet high.  He and another person were trying to unload it from a truck when the other person lost control in the patient continued to hold onto the generator.  He reports that again both of his arms but primarily hurt his right shoulder.  Has had movement pain but no swelling, bony deformity.  No appreciable swelling.  No current facility-administered medications for this encounter.  Current Outpatient Medications:    azelastine  (ASTELIN ) 0.1 % nasal spray, Place 2 sprays into both nostrils 2 (two) times daily as needed. Use in each nostril as directed, Disp: 30 mL, Rfl: 5   cetirizine  (ZYRTEC  ALLERGY ) 10 MG tablet, Take 1 tablet (10 mg total) by mouth daily., Disp: 30 tablet, Rfl: 5   cyclobenzaprine  (FLEXERIL ) 10 MG tablet, Take 1 tablet (10 mg total) by mouth 2 (two) times daily as needed for muscle spasms., Disp: 10 tablet, Rfl: 0   fluticasone  (FLONASE ) 50 MCG/ACT nasal spray, Place 2 sprays into both nostrils daily., Disp: 16 g, Rfl: 5   ibuprofen  (ADVIL ) 800 MG tablet, Take 1 tablet (800 mg total) by mouth every 8 (eight) hours as needed (pain)., Disp: 21 tablet, Rfl: 0   olopatadine  (PATANOL) 0.1 % ophthalmic solution, Place 1 drop into both eyes 2 (two) times daily. (Patient not taking: Reported on 04/15/2023), Disp: 5 mL, Rfl: 0   triamcinolone  0.1% oint-Eucerin equivalent cream 1:1 mixture, Apply topically daily as needed., Disp: 90 g, Rfl: 1   triamcinolone  ointment (KENALOG ) 0.1 %, Apply twice daily for flare ups below neck, maximum 10 days., Disp: 80 g, Rfl: 3   No Known Allergies  Past Medical History:  Diagnosis Date    Asthma    Eczema    Environmental allergies      Past Surgical History:  Procedure Laterality Date   NERVE REPAIR  01/27/2022   Procedure: NERVE REPAIR;  Surgeon: Rober Chimera, MD;  Location: [facility];  Service: Orthopedics;;   OPEN REDUCTION INTERNAL FIXATION (ORIF) METACARPAL Right 01/27/2022   Procedure: OPEN TREATMENT OF RIGHT 2ND METACARPAL FRACTURE;  Surgeon: Rober Chimera, MD;  Location: [facility];  Service: Orthopedics;  Laterality: Right;  WITH PREOP REGIONAL BLOCK   TONSILLECTOMY AND ADENOIDECTOMY      No family history on file.  Social History   Tobacco Use   Smoking status: Never   Smokeless tobacco: Never  Vaping Use   Vaping status: Former  Substance Use Topics   Alcohol use: No   Drug use: Not Currently    ROS   Objective:   Vitals: BP (!) 142/92 (BP Location: Right Arm)   Pulse (!) 57   Temp 97.6 F (36.4 C) (Oral)   Resp 16   SpO2 97%   Physical Exam Constitutional:      General: He is not in acute distress.    Appearance: Normal appearance. He is well-developed and normal weight. He is not ill-appearing, toxic-appearing or diaphoretic.  HENT:     Head: Normocephalic and atraumatic.     Right Ear: External ear normal.     Left Ear: External ear normal.     Nose: Nose normal.  Mouth/Throat:     Pharynx: Oropharynx is clear.  Eyes:     General: No scleral icterus.       Right eye: No discharge.        Left eye: No discharge.     Extraocular Movements: Extraocular movements intact.  Cardiovascular:     Rate and Rhythm: Normal rate.  Pulmonary:     Effort: Pulmonary effort is normal.  Musculoskeletal:     Right shoulder: Tenderness (- Hawkins and Neer tests, movement pain throughout) present. No swelling, deformity, effusion, laceration, bony tenderness or crepitus. Normal range of motion. Normal strength.     Cervical back: Normal range of motion.  Neurological:     Mental Status: He is alert and  oriented to person, place, and time.  Psychiatric:        Mood and Affect: Mood normal.        Behavior: Behavior normal.        Thought Content: Thought content normal.        Judgment: Judgment normal.     Assessment and Plan :   PDMP not reviewed this encounter.  1. Acute pain of right shoulder   2. Right shoulder strain, initial encounter    Deferred imaging given his range of motion, general physical exam and mechanism of injury.  Suspect a right shoulder strain and recommended naproxen , muscle relaxant, shoulder rehab.  Follow-up with an orthopedist to rule out soft tissue injury.  Counseled patient on potential for adverse effects with medications prescribed/recommended today, ER and return-to-clinic precautions discussed, patient verbalized understanding.    Adolph Hoop, [place] 09/05/23 1556

## 2023-09-24 ENCOUNTER — Ambulatory Visit: Payer: Medicaid Other | Admitting: Dermatology

## 2024-01-01 ENCOUNTER — Ambulatory Visit

## 2024-01-01 ENCOUNTER — Other Ambulatory Visit: Payer: Self-pay

## 2024-01-01 ENCOUNTER — Ambulatory Visit
Admission: EM | Admit: 2024-01-01 | Discharge: 2024-01-01 | Disposition: A | Discharge status: Home / Self Care | Payer: Self-pay | Attending: Family Medicine | Admitting: Family Medicine

## 2024-01-01 ENCOUNTER — Ambulatory Visit (INDEPENDENT_AMBULATORY_CARE_PROVIDER_SITE_OTHER): Payer: Self-pay | Admitting: Radiology

## 2024-01-01 DIAGNOSIS — M25562 Pain in left knee: Secondary | ICD-10-CM

## 2024-01-01 NOTE — ED Provider Notes (Signed)
 [facility]    CSN: [id number] Arrival date & time: 01/01/24  1251      History   Chief Complaint Chief Complaint  Patient presents with   Knee Pain    HPI Phillip Lopez is a 23 y.o. male.    Knee Pain  Patient is here for left knee pain x 5 days.  He jumped down from the back of his truck, and about 2 hrs later he had sharp pain to the left knee.  The first 3 days it was constant, and the last several days it has been more with certain movements.  Today was painful to bend over to put on his sock.  The pain feels like it is under the knee cap, superior aspect.  It was swollen, but that has improved.        Past Medical History:  Diagnosis Date   Asthma    Eczema    Environmental allergies     Patient Active Problem List   Diagnosis Date Noted   Overweight 03/02/2009   Asthma 08/20/2007   Sinusitis, chronic 11/10/2006   Allergic rhinitis 07/21/2006    Past Surgical History:  Procedure Laterality Date   NERVE REPAIR  01/27/2022   Procedure: NERVE REPAIR;  Surgeon: Sebastian Lenis, MD;  Location: [facility];  Service: Orthopedics;;   OPEN REDUCTION INTERNAL FIXATION (ORIF) METACARPAL Right 01/27/2022   Procedure: OPEN TREATMENT OF RIGHT 2ND METACARPAL FRACTURE;  Surgeon: Sebastian Lenis, MD;  Location: [facility];  Service: Orthopedics;  Laterality: Right;  WITH PREOP REGIONAL BLOCK   TONSILLECTOMY AND ADENOIDECTOMY         Home Medications    Prior to Admission medications   Medication Sig Start Date End Date Taking? Authorizing Provider  azelastine  (ASTELIN ) 0.1 % nasal spray Place 2 sprays into both nostrils 2 (two) times daily as needed. Use in each nostril as directed 05/19/23   Tobie Arleta SQUIBB, MD  cetirizine  (ZYRTEC  ALLERGY ) 10 MG tablet Take 1 tablet (10 mg total) by mouth daily. 05/19/23   Tobie Arleta SQUIBB, MD  cyclobenzaprine  (FLEXERIL ) 5 MG tablet Take 1 tablet (5 mg total) by mouth at bedtime as  needed. 09/05/23   Christopher Savannah, PA-C  fluticasone  (FLONASE ) 50 MCG/ACT nasal spray Place 2 sprays into both nostrils daily. 05/19/23   Tobie Arleta SQUIBB, MD  ibuprofen  (ADVIL ) 800 MG tablet Take 1 tablet (800 mg total) by mouth every 8 (eight) hours as needed (pain). 07/04/23   Iola Lukes, FNP  naproxen  (NAPROSYN ) 375 MG tablet Take 1 tablet (375 mg total) by mouth 2 (two) times daily with a meal. 09/05/23   Christopher Savannah, PA-C  olopatadine  (PATANOL) 0.1 % ophthalmic solution Place 1 drop into both eyes 2 (two) times daily. Patient not taking: Reported on 04/15/2023 07/18/22   Hazen Darryle BRAVO, FNP  triamcinolone  0.1% oint-Eucerin equivalent cream 1:1 mixture Apply topically daily as needed. 04/17/23   Raspet, Halina Asano K, PA-C  triamcinolone  ointment (KENALOG ) 0.1 % Apply twice daily for flare ups below neck, maximum 10 days. 05/05/23   Tobie Arleta SQUIBB, MD    Family History History reviewed. No pertinent family history.  Social History Social History   Tobacco Use   Smoking status: Never   Smokeless tobacco: Never  Vaping Use   Vaping status: Former  Substance Use Topics   Alcohol use: No   Drug use: Not Currently     Allergies   Patient has no known allergies.  Review of Systems Review of Systems  Constitutional: Negative.   HENT: Negative.    Respiratory: Negative.    Cardiovascular: Negative.   Gastrointestinal: Negative.   Musculoskeletal:  Positive for arthralgias.     Physical Exam Triage Vital Signs ED Triage Vitals  Encounter Vitals Group     BP 01/01/24 1311 126/82     Girls Systolic BP Percentile --      Girls Diastolic BP Percentile --      Boys Systolic BP Percentile --      Boys Diastolic BP Percentile --      Pulse Rate 01/01/24 1311 (!) 42     Resp 01/01/24 1311 16     Temp 01/01/24 1311 98.1 F (36.7 C)     Temp Source 01/01/24 1311 Oral     SpO2 01/01/24 1311 98 %     Weight 01/01/24 1311 180 lb (81.6 kg)     Height 01/01/24 1311 5' 7 (1.702 m)     Head  Circumference --      Peak Flow --      Pain Score 01/01/24 1326 4     Pain Loc --      Pain Education --      Exclude from Growth Chart --    No data found.  Updated Vital Signs BP 126/82 (BP Location: Right Arm)   Pulse (!) 42   Temp 98.1 F (36.7 C) (Oral)   Resp 16   Ht 5' 7 (1.702 m)   Wt 81.6 kg   SpO2 98%   BMI 28.19 kg/m   Visual Acuity Right Eye Distance:   Left Eye Distance:   Bilateral Distance:    Right Eye Near:   Left Eye Near:    Bilateral Near:     Physical Exam Constitutional:      Appearance: Normal appearance. He is normal weight.  Cardiovascular:     Rate and Rhythm: Normal rate and regular rhythm.  Pulmonary:     Effort: Pulmonary effort is normal.     Breath sounds: Normal breath sounds.  Musculoskeletal:     Comments: No swelling to the knee;  no TTP posteriorly, medially, laterally;  slight pain to the superior aspect of the knee cap;  pain with full extension and flexion, but has full ROM  Neurological:     Mental Status: He is alert.      UC Treatments / Results  Labs (all labs ordered are listed, but only abnormal results are displayed) Labs Reviewed - No data to display  EKG   Radiology DG Knee Complete 4 Views Left Result Date: 01/01/2024 CLINICAL DATA:  Acute pain of left knee. EXAM: LEFT KNEE - COMPLETE 4+ VIEW COMPARISON:  None Available. FINDINGS: No evidence of fracture, dislocation, or joint effusion. The alignment and joint spaces are normal. No evidence of arthropathy or other focal bone abnormality. Soft tissues are unremarkable. IMPRESSION: Negative radiographs of the left knee. Electronically Signed   By: Andrea Gasman M.D.   On: 01/01/2024 14:08    Procedures Procedures (including critical care time)  Medications Ordered in UC Medications - No data to display  Initial Impression / Assessment and Plan / UC Course  I have reviewed the triage vital signs and the nursing notes.  Pertinent labs & imaging  results that were available during my care of the patient were reviewed by me and considered in my medical decision making (see chart for details).   Final Clinical Impressions(s) / UC  Diagnoses   Final diagnoses:  Acute pain of left knee     Discharge Instructions      You were seen today for knee pain.  Your xray was normal.  As discussed, you may wish to follow up with an orthopedist for further care and work up.  You may call [facility] at [phone] for an appointment.  You can use motrin  for pain, and apply ice as well.     ED Prescriptions   None    PDMP not reviewed this encounter.   Darral Longs, MD 01/01/24 1421

## 2024-01-01 NOTE — ED Triage Notes (Signed)
 Pt presents with a chief complaint of left knee pain x 5 days. States he was at home moving boxes. Was on the tailgate of his truck and jumped down. About two hours later, began to have sharp pains in his left knee. Currently rates pain a 4/10. Pain increases with movement. Full ROM in knee. OTC Ibuprofen  + Tylenol  taken with no improvement/relief.

## 2024-01-01 NOTE — Discharge Instructions (Signed)
 You were seen today for knee pain.  Your xray was normal.  As discussed, you may wish to follow up with an orthopedist for further care and work up.  You may call [facility] at [phone] for an appointment.  You can use motrin  for pain, and apply ice as well.
